# Patient Record
Sex: Female | Born: 1965 | Race: White | Hispanic: No | Marital: Married | Smoking: Former smoker
Health system: Southern US, Community
[De-identification: ages and names within clinical notes are randomized; demographics above are authoritative.]

## PROBLEM LIST (undated history)

## (undated) DIAGNOSIS — R809 Proteinuria, unspecified: Secondary | ICD-10-CM

## (undated) DIAGNOSIS — K219 Gastro-esophageal reflux disease without esophagitis: Secondary | ICD-10-CM

## (undated) DIAGNOSIS — E785 Hyperlipidemia, unspecified: Secondary | ICD-10-CM

## (undated) DIAGNOSIS — E119 Type 2 diabetes mellitus without complications: Secondary | ICD-10-CM

## (undated) DIAGNOSIS — I10 Essential (primary) hypertension: Secondary | ICD-10-CM

## (undated) DIAGNOSIS — J45909 Unspecified asthma, uncomplicated: Secondary | ICD-10-CM

## (undated) HISTORY — DX: Essential (primary) hypertension: I10

## (undated) HISTORY — DX: Hyperlipidemia, unspecified: E78.5

## (undated) HISTORY — DX: Unspecified asthma, uncomplicated: J45.909

## (undated) HISTORY — DX: Proteinuria, unspecified: R80.9

## (undated) HISTORY — DX: Type 2 diabetes mellitus without complications: E11.9

## (undated) HISTORY — PX: HERNIA REPAIR: SHX51

## (undated) HISTORY — DX: Gastro-esophageal reflux disease without esophagitis: K21.9

---

## 1999-01-27 ENCOUNTER — Other Ambulatory Visit: Admission: RE | Admit: 1999-01-27 | Discharge: 1999-01-27 | Payer: Self-pay | Admitting: Family Medicine

## 2000-05-05 ENCOUNTER — Other Ambulatory Visit: Admission: RE | Admit: 2000-05-05 | Discharge: 2000-05-05 | Payer: Self-pay | Admitting: Family Medicine

## 2001-06-22 ENCOUNTER — Other Ambulatory Visit: Admission: RE | Admit: 2001-06-22 | Discharge: 2001-06-22 | Payer: Self-pay | Admitting: Family Medicine

## 2002-06-30 ENCOUNTER — Other Ambulatory Visit: Admission: RE | Admit: 2002-06-30 | Discharge: 2002-06-30 | Payer: Self-pay | Admitting: Family Medicine

## 2002-07-04 ENCOUNTER — Ambulatory Visit (HOSPITAL_COMMUNITY): Admission: RE | Admit: 2002-07-04 | Discharge: 2002-07-04 | Payer: Self-pay | Admitting: Family Medicine

## 2002-07-04 ENCOUNTER — Encounter: Payer: Self-pay | Admitting: Family Medicine

## 2011-09-07 ENCOUNTER — Other Ambulatory Visit: Payer: Self-pay | Admitting: Family Medicine

## 2011-09-09 ENCOUNTER — Ambulatory Visit
Admission: RE | Admit: 2011-09-09 | Discharge: 2011-09-09 | Disposition: A | Discharge status: Home / Self Care | Payer: BC Managed Care – PPO | Source: Ambulatory Visit | Attending: Family Medicine | Admitting: Family Medicine

## 2012-11-03 ENCOUNTER — Ambulatory Visit: Payer: Self-pay | Admitting: Family Medicine

## 2013-04-24 ENCOUNTER — Other Ambulatory Visit: Payer: Self-pay | Admitting: Family Medicine

## 2013-04-26 NOTE — Telephone Encounter (Signed)
LAST SEEN 07/01/12

## 2013-04-28 NOTE — Telephone Encounter (Signed)
Patient needs to be seen. Has exceeded time since last visit. Limited quantity refilled. Needs to bring all medications to next appointment.   

## 2013-06-08 ENCOUNTER — Ambulatory Visit (INDEPENDENT_AMBULATORY_CARE_PROVIDER_SITE_OTHER): Payer: BC Managed Care – PPO | Admitting: Family Medicine

## 2013-06-08 ENCOUNTER — Telehealth: Payer: Self-pay | Admitting: Family Medicine

## 2013-06-08 ENCOUNTER — Encounter: Payer: Self-pay | Admitting: Family Medicine

## 2013-06-08 VITALS — BP 135/79 | HR 90 | Temp 97.0°F | Ht 67.0 in | Wt 171.6 lb

## 2013-06-08 DIAGNOSIS — E1169 Type 2 diabetes mellitus with other specified complication: Secondary | ICD-10-CM | POA: Insufficient documentation

## 2013-06-08 DIAGNOSIS — I10 Essential (primary) hypertension: Secondary | ICD-10-CM

## 2013-06-08 DIAGNOSIS — E785 Hyperlipidemia, unspecified: Secondary | ICD-10-CM

## 2013-06-08 DIAGNOSIS — E1159 Type 2 diabetes mellitus with other circulatory complications: Secondary | ICD-10-CM | POA: Insufficient documentation

## 2013-06-08 DIAGNOSIS — J45909 Unspecified asthma, uncomplicated: Secondary | ICD-10-CM | POA: Insufficient documentation

## 2013-06-08 DIAGNOSIS — K219 Gastro-esophageal reflux disease without esophagitis: Secondary | ICD-10-CM | POA: Insufficient documentation

## 2013-06-08 DIAGNOSIS — E119 Type 2 diabetes mellitus without complications: Secondary | ICD-10-CM | POA: Insufficient documentation

## 2013-06-08 DIAGNOSIS — R809 Proteinuria, unspecified: Secondary | ICD-10-CM | POA: Insufficient documentation

## 2013-06-08 DIAGNOSIS — I152 Hypertension secondary to endocrine disorders: Secondary | ICD-10-CM | POA: Insufficient documentation

## 2013-06-08 LAB — POCT UA - MICROALBUMIN: Microalbumin Ur, POC: NEGATIVE mg/L

## 2013-06-08 LAB — POCT GLYCOSYLATED HEMOGLOBIN (HGB A1C): Hemoglobin A1C: 8.3

## 2013-06-08 MED ORDER — LISINOPRIL-HYDROCHLOROTHIAZIDE 10-12.5 MG PO TABS: 1.0000 | ORAL_TABLET | Freq: Every day | ORAL | Status: DC

## 2013-06-08 MED ORDER — ALBUTEROL SULFATE HFA 108 (90 BASE) MCG/ACT IN AERS: INHALATION_SPRAY | RESPIRATORY_TRACT | Status: DC

## 2013-06-08 MED ORDER — SITAGLIPTIN PHOSPHATE 100 MG PO TABS: 100.0000 mg | ORAL_TABLET | Freq: Every day | ORAL | Status: DC

## 2013-06-08 MED ORDER — FLUTICASONE-SALMETEROL 250-50 MCG/DOSE IN AEPB: 1.0000 | INHALATION_SPRAY | Freq: Two times a day (BID) | RESPIRATORY_TRACT | Status: DC

## 2013-06-08 MED ORDER — GLIPIZIDE 5 MG PO TABS: 5.0000 mg | ORAL_TABLET | Freq: Two times a day (BID) | ORAL | Status: DC

## 2013-06-08 NOTE — Patient Instructions (Signed)
    Dr Bonnie Roig's Recommendations  For nutrition information, I recommend books:  1).Eat to Live by Dr Joel Fuhrman. 2).Prevent and Reverse Heart Disease by Dr Caldwell Esselstyn. 3) Dr Neal Barnard's Book:  Program to Reverse Diabetes  Exercise recommendations are:  If unable to walk, then the patient can exercise in a chair 3 times a day. By flapping arms like a bird gently and raising legs outwards to the front.  If ambulatory, the patient can go for walks for 30 minutes 3 times a week. Then increase the intensity and duration as tolerated.  Goal is to try to attain exercise frequency to 5 times a week.  If applicable: Best to perform resistance exercises (machines or weights) 2 days a week and cardio type exercises 3 days per week.   Diabetes and Foot Care Diabetes may cause you to have problems because of poor blood supply (circulation) to your feet and legs. This may cause the skin on your feet to become thinner, break easier, and heal more slowly. Your skin may become dry, and the skin may peel and crack. You may also have nerve damage in your legs and feet causing decreased feeling in them. You may not notice minor injuries to your feet that could lead to infections or more serious problems. Taking care of your feet is one of the most important things you can do for yourself.  HOME CARE INSTRUCTIONS  Wear shoes at all times, even in the house. Do not go barefoot. Bare feet are easily injured.  Check your feet daily for blisters, cuts, and redness. If you cannot see the bottom of your feet, use a mirror or ask someone for help.  Wash your feet with warm water (do not use hot water) and mild soap. Then pat your feet and the areas between your toes until they are completely dry. Do not soak your feet as this can dry your skin.  Apply a moisturizing lotion or petroleum jelly (that does not contain alcohol and is unscented) to the skin on your feet and to dry, brittle toenails.  Do not apply lotion between your toes.  Trim your toenails straight across. Do not dig under them or around the cuticle. File the edges of your nails with an emery board or nail file.  Do not cut corns or calluses or try to remove them with medicine.  Wear clean socks or stockings every day. Make sure they are not too tight. Do not wear knee-high stockings since they may decrease blood flow to your legs.  Wear shoes that fit properly and have enough cushioning. To break in new shoes, wear them for just a few hours a day. This prevents you from injuring your feet. Always look in your shoes before you put them on to be sure there are no objects inside.  Do not cross your legs. This may decrease the blood flow to your feet.  If you find a minor scrape, cut, or break in the skin on your feet, keep it and the skin around it clean and dry. These areas may be cleansed with mild soap and water. Do not cleanse the area with peroxide, alcohol, or iodine.  When you remove an adhesive bandage, be sure not to damage the skin around it.  If you have a wound, look at it several times a day to make sure it is healing.  Do not use heating pads or hot water bottles. They may burn your skin. If   you have lost feeling in your feet or legs, you may not know it is happening until it is too late.  Make sure your health care provider performs a complete foot exam at least annually or more often if you have foot problems. Report any cuts, sores, or bruises to your health care provider immediately. SEEK MEDICAL CARE IF:   You have an injury that is not healing.  You have cuts or breaks in the skin.  You have an ingrown nail.  You notice redness on your legs or feet.  You feel burning or tingling in your legs or feet.  You have pain or cramps in your legs and feet.  Your legs or feet are numb.  Your feet always feel cold. SEEK IMMEDIATE MEDICAL CARE IF:   There is increasing redness, swelling, or pain in  or around a wound.  There is a red line that goes up your leg.  Pus is coming from a wound.  You develop a fever or as directed by your health care provider.  You notice a bad smell coming from an ulcer or wound. Document Released: 05/01/2000 Document Revised: 01/04/2013 Document Reviewed: 10/11/2012 [employer] Patient Information 2014 [employer].  

## 2013-06-08 NOTE — Telephone Encounter (Signed)
PT AWARE THERE ARE NO SAMPLES LEFT IN ANY BATHROOM

## 2013-06-08 NOTE — Progress Notes (Signed)
Patient ID: Brittany Jenkins, female   DOB: [birth year], 48 y.o.   MRN: [id number] SUBJECTIVE: CC: Chief Complaint  Patient presents with  . Follow-up    ck up diabetes     HPI: Patient is here for follow up of Diabetes Mellitus: Symptoms evaluated: Denies Nocturia ,Denies Urinary Frequency , denies Blurred vision ,deniesDizziness,denies.Dysuria,denies paresthesias, denies extremity pain or ulcers.Marland Kitchendenies chest pain. has had an annual eye exam. do check the feet. Does check CBGs. Average CBG: 122.  Denies episodes of hypoglycemia. Does have an emergency hypoglycemic plan. admits toCompliance with medications. Denies Problems with medications.   Has a flare up of asthma. Dad had a respiratory infection she caught it and still sick with cough. No fever. Had a lot of congestion. And phlegm. Her sugars were good until she got sick.  Past Medical History  Diagnosis Date  . Diabetes mellitus without complication   . Asthma   . GERD (gastroesophageal reflux disease)   . Hyperlipidemia   . Hypertension   . Microalbuminuria    No past surgical history on file. History   Social History  . Marital Status: Married    Spouse Name: N/A    Number of Children: N/A  . Years of Education: N/A   Occupational History  . Not on file.   Social History Main Topics  . Smoking status: Former Smoker    Types: Cigarettes    Quit date: 05/18/1998  . Smokeless tobacco: Not on file  . Alcohol Use: Not on file  . Drug Use: Not on file  . Sexual Activity: Not on file   Other Topics Concern  . Not on file   Social History Narrative  . No narrative on file   No family history on file. No current outpatient prescriptions on file prior to visit.   No current facility-administered medications on file prior to visit.   Allergies  Allergen Reactions  . Erythromycin    Immunization History  Administered Date(s) Administered  . Tdap 04/08/2008   Prior to Admission medications    Medication Sig Start Date End Date Taking? Authorizing Provider  VENTOLIN HFA 108 (90 BASE) MCG/ACT inhaler INHALE 2 PUFFS BY MOUTH EVERY 4 TO 6 HOURS AS A RESCUE FOR ASTHMA 04/24/13   Vernie Shanks, MD     ROS: As above in the HPI. All other systems are stable or negative.  OBJECTIVE: APPEARANCE:  Patient in no acute distress.The patient appeared well nourished and normally developed. Acyanotic. Waist: VITAL SIGNS:BP 135/79  Pulse 90  Temp(Src) 97 F (36.1 C) (Oral)  Ht $R'5\' 7"'jJ$  (9.628 m)  Wt 171 lb 9.6 oz (77.837 kg)  BMI 26.87 kg/m2  LMP 05/08/2013 WF   SKIN: warm and  Dry without overt rashes, tattoos and scars  HEAD and Neck: without JVD, Head and scalp: normal Eyes:No scleral icterus. Fundi normal, eye movements normal. Ears: Auricle normal, canal normal, Tympanic membranes normal, insufflation normal. Nose: normal Throat: normal Neck & thyroid: normal  CHEST & LUNGS: Chest wall: normal Lungs: Clear  CVS: Reveals the PMI to be normally located. Regular rhythm, First and Second Heart sounds are normal,  absence of murmurs, rubs or gallops. Peripheral vasculature: Radial pulses: normal Dorsal pedis pulses: normal Posterior pulses: normal  ABDOMEN:  Appearance: normal Benign, no organomegaly, no masses, no Abdominal Aortic enlargement. No Guarding , no rebound. No Bruits. Bowel sounds: normal  RECTAL: N/A GU: N/A  EXTREMETIES: nonedematous.  MUSCULOSKELETAL:  Spine: normal Joints: intact  NEUROLOGIC: oriented to time,place  and person; nonfocal. Strength is normal Sensory is normal Reflexes are normal Cranial Nerves are normal.  ASSESSMENT:  Microalbuminuria - Plan: POCT UA - Microalbumin  Hypertension - Plan: lisinopril-hydrochlorothiazide (PRINZIDE,ZESTORETIC) 10-12.5 MG per tablet, CMP14+EGFR  Hyperlipidemia - Plan: CMP14+EGFR, NMR, lipoprofile  Diabetes mellitus without complication - Plan: glipiZIDE (GLUCOTROL) 5 MG tablet, sitaGLIPtin  (JANUVIA) 100 MG tablet, POCT glycosylated hemoglobin (Hb A1C), POCT UA - Microalbumin  GERD (gastroesophageal reflux disease)  Asthma - Plan: Fluticasone-Salmeterol (ADVAIR) 250-50 MCG/DOSE AEPB, albuterol (VENTOLIN HFA) 108 (90 BASE) MCG/ACT inhaler  PLAN:      Dr Paula Libra Recommendations  For nutrition information, I recommend books:  1).Eat to Live by Dr Excell Seltzer. 2).Prevent and Reverse Heart Disease by Dr Karl Luke. 3) Dr Janene Harvey Book:  Program to Reverse Diabetes  Exercise recommendations are:  If unable to walk, then the patient can exercise in a chair 3 times a day. By flapping arms like a bird gently and raising legs outwards to the front.  If ambulatory, the patient can go for walks for 30 minutes 3 times a week. Then increase the intensity and duration as tolerated.  Goal is to try to attain exercise frequency to 5 times a week.  If applicable: Best to perform resistance exercises (machines or weights) 2 days a week and cardio type exercises 3 days per week.   DM foot Care discussed and handout in AVS.  Orders Placed This Encounter  Procedures  . CMP14+EGFR  . NMR, lipoprofile  . POCT glycosylated hemoglobin (Hb A1C)  . POCT UA - Microalbumin   Meds ordered this encounter  Medications  . DISCONTD: ADVAIR DISKUS 100-50 MCG/DOSE AEPB    Sig: Inhale 1 puff into the lungs at bedtime.   Marland Kitchen DISCONTD: glipiZIDE (GLUCOTROL) 5 MG tablet    Sig: Take 5 mg by mouth. Take total of $Remove'10mg'JlxVSIl$  nightly  . DISCONTD: lisinopril-hydrochlorothiazide (PRINZIDE,ZESTORETIC) 10-12.5 MG per tablet    Sig: Take 1 tablet by mouth daily.   Marland Kitchen DISCONTD: JANUVIA 100 MG tablet    Sig: Take 100 mg by mouth daily.   . traMADol (ULTRAM) 50 MG tablet    Sig: Take by mouth every 6 (six) hours as needed.  Marland Kitchen omeprazole (PRILOSEC OTC) 20 MG tablet    Sig: Take 20 mg by mouth daily.  . vitamin E 400 UNIT capsule    Sig: Take 400 Units by mouth daily.  . Omega-3 Fatty Acids  (FISH OIL) 1000 MG CAPS    Sig: Take 2 capsules by mouth daily.  . Fluticasone-Salmeterol (ADVAIR) 250-50 MCG/DOSE AEPB    Sig: Inhale 1 puff into the lungs 2 (two) times daily.    Dispense:  60 each    Refill:  3  . albuterol (VENTOLIN HFA) 108 (90 BASE) MCG/ACT inhaler    Sig: INHALE 2 PUFFS BY MOUTH EVERY 4 TO 6 HOURS AS A RESCUE FOR ASTHMA    Dispense:  1 Inhaler    Refill:  2    Last refill. Needs to be seen.  Marland Kitchen glipiZIDE (GLUCOTROL) 5 MG tablet    Sig: Take 1 tablet (5 mg total) by mouth 2 (two) times daily before a meal. Take total of $Remove'10mg'CjeZoBZ$  nightly    Dispense:  60 tablet    Refill:  5  . sitaGLIPtin (JANUVIA) 100 MG tablet    Sig: Take 1 tablet (100 mg total) by mouth daily.    Dispense:  30 tablet    Refill:  5  .  lisinopril-hydrochlorothiazide (PRINZIDE,ZESTORETIC) 10-12.5 MG per tablet    Sig: Take 1 tablet by mouth daily.    Dispense:  30 tablet    Refill:  5   Medications Discontinued During This Encounter  Medication Reason  . ADVAIR DISKUS 100-50 MCG/DOSE AEPB Dose change  . VENTOLIN HFA 108 (90 BASE) MCG/ACT inhaler Reorder  . glipiZIDE (GLUCOTROL) 5 MG tablet Reorder  . JANUVIA 100 MG tablet Reorder  . lisinopril-hydrochlorothiazide (PRINZIDE,ZESTORETIC) 10-12.5 MG per tablet Reorder   Return in about 3 months (around 09/06/2013) for Recheck medical problems.  Staley Budzinski P. Jacelyn Grip, M.D.

## 2013-06-09 ENCOUNTER — Telehealth: Payer: Self-pay | Admitting: Family Medicine

## 2013-06-09 NOTE — Telephone Encounter (Signed)
Samples of januvia up front. Advair not available. Patient notified

## 2013-06-10 LAB — CMP14+EGFR
ALT: 16 IU/L (ref 0–32)
AST: 13 IU/L (ref 0–40)
Albumin/Globulin Ratio: 1.5 (ref 1.1–2.5)
Albumin: 4.1 g/dL (ref 3.5–5.5)
Alkaline Phosphatase: 64 IU/L (ref 39–117)
BUN/Creatinine Ratio: 14 (ref 9–23)
BUN: 10 mg/dL (ref 6–24)
CO2: 22 mmol/L (ref 18–29)
Calcium: 9.3 mg/dL (ref 8.7–10.2)
Chloride: 97 mmol/L (ref 97–108)
Creatinine, Ser: 0.7 mg/dL (ref 0.57–1.00)
GFR calc Af Amer: 118 mL/min/{1.73_m2} (ref >59)
GFR calc non Af Amer: 102 mL/min/{1.73_m2} (ref >59)
Globulin, Total: 2.7 g/dL (ref 1.5–4.5)
Glucose: 166 mg/dL — ABNORMAL HIGH (ref 65–99)
Potassium: 4.1 mmol/L (ref 3.5–5.2)
Sodium: 137 mmol/L (ref 134–144)
Total Bilirubin: 0.2 mg/dL (ref 0.0–1.2)
Total Protein: 6.8 g/dL (ref 6.0–8.5)

## 2013-06-10 LAB — NMR, LIPOPROFILE
Cholesterol: 222 mg/dL — ABNORMAL HIGH (ref <200)
HDL Cholesterol by NMR: 54 mg/dL (ref >=40)
HDL Particle Number: 33.3 umol/L (ref >=30.5)
LDL Particle Number: 2208 nmol/L — ABNORMAL HIGH (ref <1000)
LDL Size: 20.5 nm — ABNORMAL LOW (ref >20.5)
LDLC SERPL CALC-MCNC: 141 mg/dL — ABNORMAL HIGH (ref <100)
LP-IR Score: 83 — ABNORMAL HIGH (ref <=45)
Small LDL Particle Number: 1232 nmol/L — ABNORMAL HIGH (ref <=527)
Triglycerides by NMR: 136 mg/dL (ref <150)

## 2013-06-14 ENCOUNTER — Other Ambulatory Visit: Payer: Self-pay | Admitting: *Deleted

## 2013-06-14 NOTE — Telephone Encounter (Signed)
Last seen 06/08/13, last filled 10/12/12. Rx will print have nurse notify pt

## 2013-06-15 ENCOUNTER — Other Ambulatory Visit: Payer: Self-pay | Admitting: *Deleted

## 2013-06-15 NOTE — Telephone Encounter (Signed)
Patient last seen in office on 06-08-13. Please advise. If approved please print and route to Pool A so nurse can phone in to pharmacy

## 2013-06-16 MED ORDER — TRAMADOL HCL 50 MG PO TABS: 50.0000 mg | ORAL_TABLET | Freq: Four times a day (QID) | ORAL | Status: DC | PRN

## 2013-06-16 NOTE — Telephone Encounter (Signed)
Ready to be picked up by patient left message

## 2013-06-16 NOTE — Telephone Encounter (Signed)
Rx ready for pick up. 

## 2013-09-08 ENCOUNTER — Ambulatory Visit: Payer: BC Managed Care – PPO | Admitting: Family Medicine

## 2013-12-21 ENCOUNTER — Telehealth: Payer: Self-pay | Admitting: Family Medicine

## 2013-12-21 DIAGNOSIS — E119 Type 2 diabetes mellitus without complications: Secondary | ICD-10-CM

## 2013-12-22 MED ORDER — GLIPIZIDE 5 MG PO TABS: 5.0000 mg | ORAL_TABLET | Freq: Two times a day (BID) | ORAL | Status: DC

## 2013-12-22 NOTE — Telephone Encounter (Signed)
done

## 2014-01-18 ENCOUNTER — Other Ambulatory Visit: Payer: Self-pay | Admitting: Family Medicine

## 2014-01-23 ENCOUNTER — Other Ambulatory Visit: Payer: Self-pay | Admitting: Family Medicine

## 2014-01-23 DIAGNOSIS — E119 Type 2 diabetes mellitus without complications: Secondary | ICD-10-CM

## 2014-01-24 NOTE — Telephone Encounter (Signed)
Has appt with you on 03/26/14

## 2014-01-25 MED ORDER — SITAGLIPTIN PHOSPHATE 100 MG PO TABS: 100.0000 mg | ORAL_TABLET | Freq: Every day | ORAL | Status: DC

## 2014-01-25 MED ORDER — ALBUTEROL SULFATE HFA 108 (90 BASE) MCG/ACT IN AERS: INHALATION_SPRAY | RESPIRATORY_TRACT | Status: DC

## 2014-01-26 ENCOUNTER — Encounter: Payer: Self-pay | Admitting: *Deleted

## 2014-02-19 ENCOUNTER — Other Ambulatory Visit: Payer: Self-pay | Admitting: *Deleted

## 2014-02-19 MED ORDER — LISINOPRIL-HYDROCHLOROTHIAZIDE 10-12.5 MG PO TABS: 1.0000 | ORAL_TABLET | Freq: Every day | ORAL | Status: DC

## 2014-03-15 ENCOUNTER — Other Ambulatory Visit: Payer: Self-pay | Admitting: Family Medicine

## 2014-03-20 ENCOUNTER — Ambulatory Visit: Payer: BC Managed Care – PPO | Admitting: Family Medicine

## 2014-03-21 ENCOUNTER — Encounter: Payer: Self-pay | Admitting: Family Medicine

## 2014-03-21 ENCOUNTER — Ambulatory Visit (INDEPENDENT_AMBULATORY_CARE_PROVIDER_SITE_OTHER): Payer: BC Managed Care – PPO | Admitting: Family Medicine

## 2014-03-21 VITALS — BP 129/67 | HR 93 | Temp 97.0°F | Ht 67.0 in | Wt 168.2 lb

## 2014-03-21 DIAGNOSIS — E785 Hyperlipidemia, unspecified: Secondary | ICD-10-CM

## 2014-03-21 DIAGNOSIS — I10 Essential (primary) hypertension: Secondary | ICD-10-CM

## 2014-03-21 DIAGNOSIS — E119 Type 2 diabetes mellitus without complications: Secondary | ICD-10-CM

## 2014-03-21 DIAGNOSIS — J4530 Mild persistent asthma, uncomplicated: Secondary | ICD-10-CM

## 2014-03-21 LAB — POCT GLYCOSYLATED HEMOGLOBIN (HGB A1C): Hemoglobin A1C: 9

## 2014-03-21 MED ORDER — TRAMADOL HCL 50 MG PO TABS: 50.0000 mg | ORAL_TABLET | Freq: Four times a day (QID) | ORAL | Status: DC | PRN

## 2014-03-21 MED ORDER — SITAGLIPTIN PHOSPHATE 100 MG PO TABS: 100.0000 mg | ORAL_TABLET | Freq: Every day | ORAL | Status: DC

## 2014-03-21 MED ORDER — EZETIMIBE 10 MG PO TABS: 10.0000 mg | ORAL_TABLET | Freq: Every day | ORAL | Status: DC

## 2014-03-21 MED ORDER — IPRATROPIUM-ALBUTEROL 0.5-2.5 (3) MG/3ML IN SOLN
3.0000 mL | Freq: Four times a day (QID) | RESPIRATORY_TRACT | Status: DC | PRN
Start: 2014-03-21 — End: 2015-04-24

## 2014-03-21 MED ORDER — LISINOPRIL-HYDROCHLOROTHIAZIDE 10-12.5 MG PO TABS: 1.0000 | ORAL_TABLET | Freq: Every day | ORAL | Status: DC

## 2014-03-21 MED ORDER — FLUTICASONE-SALMETEROL 250-50 MCG/DOSE IN AEPB: 1.0000 | INHALATION_SPRAY | Freq: Two times a day (BID) | RESPIRATORY_TRACT | Status: DC

## 2014-03-21 NOTE — Progress Notes (Signed)
   Subjective:    Patient ID: Brittany Jenkins, female    DOB: May 05, 1966, 48 y.o.   MRN: [id number]  HPI 48 year old female here to follow-up with her diabetes, hypertension, and hyperlipidemia,. Blood pressure is well controlled with lisinopril hydrochlorothiazide. Even though she has lost 4 pounds since her last visit she states that her A1c will not be at goal because she does not follow her diet very well. She did not follow-up with the clinical pharmacist has requested. Her asthma does pretty good with Advair and when necessary albuterol. She does like to have you and have for her nebulizer when needed.    Review of Systems  Constitutional: Negative.   HENT: Negative.   Eyes: Negative.   Respiratory: Positive for wheezing.   Cardiovascular: Negative.   Gastrointestinal: Negative.   Endocrine: Negative.   Genitourinary: Negative.   Musculoskeletal: Positive for myalgias.       Bilateral thumb pain  Skin: Rash: not true rash but healing intertrigo.  Hematological: Negative.   Psychiatric/Behavioral: Negative.        Objective:   Physical Exam  Constitutional: She is oriented to person, place, and time. She appears well-developed and well-nourished.  Eyes: Conjunctivae and EOM are normal.  Neck: Normal range of motion. Neck supple.  Cardiovascular: Normal rate, regular rhythm and normal heart sounds.   Pulmonary/Chest: Effort normal and breath sounds normal.  Abdominal: Soft. Bowel sounds are normal.  Musculoskeletal: Normal range of motion.  Neurological: She is alert and oriented to person, place, and time. She has normal reflexes.  Skin: Skin is warm and dry.  Psychiatric: She has a normal mood and affect. Her behavior is normal. Thought content normal.    BP 129/67 mmHg  Pulse 93  Temp(Src) 97 F (36.1 C) (Oral)  Ht 5\' 7"  (1.702 m)  Wt 168 lb 3.2 oz (76.295 kg)  BMI 26.34 kg/m2      Assessment & Plan:  1. Essential hypertension Blood pressure well controlled  on current medical regimen. There are no side effects.  2. Diabetes mellitus without complication Suspect A1c will not be at goal may need to adjust current medicines or consider adding endochondroma or similar medicine  3. Hyperlipidemia We'll add Zetia and recheck lipids in 3 months  4. Asthma, mild persistent, uncomplicated  - ipratropium-albuterol (DUONEB) 0.5-2.5 (3) MG/3ML SOLN; Take 3 mLs by nebulization every 6 (six) hours as needed.  Dispense: 360 mL; Refill: 1

## 2014-03-21 NOTE — Patient Instructions (Signed)

## 2014-04-02 ENCOUNTER — Other Ambulatory Visit: Payer: Self-pay | Admitting: Family Medicine

## 2014-04-03 ENCOUNTER — Telehealth: Payer: Self-pay | Admitting: Family Medicine

## 2014-04-04 ENCOUNTER — Other Ambulatory Visit: Payer: Self-pay

## 2014-04-04 MED ORDER — GLIPIZIDE 5 MG PO TABS: 5.0000 mg | ORAL_TABLET | Freq: Four times a day (QID) | ORAL | Status: DC

## 2014-04-04 NOTE — Telephone Encounter (Signed)
Okay to refill meds per patient request

## 2014-04-09 ENCOUNTER — Other Ambulatory Visit: Payer: Self-pay | Admitting: Family Medicine

## 2014-04-10 NOTE — Telephone Encounter (Signed)
Last refill 03/15/14.

## 2014-06-21 ENCOUNTER — Other Ambulatory Visit: Payer: Self-pay | Admitting: Family Medicine

## 2014-07-05 ENCOUNTER — Other Ambulatory Visit: Payer: Self-pay | Admitting: Family Medicine

## 2014-07-09 ENCOUNTER — Telehealth: Payer: Self-pay | Admitting: *Deleted

## 2014-07-09 NOTE — Telephone Encounter (Signed)
BS yesterday were 300-350. This am it was 263 at 8:30. She is on glipizide 5mg  QID and [place] and she wants to know what she can do. Patient see's Hyacinth MeekerMiller.

## 2014-07-09 NOTE — Telephone Encounter (Signed)
This patient really needs to come in and see a provider. She needs a BMP and a hemoglobin A1c and she needs to check her blood sugars more regularly before meals and at bedtime. Please schedule a visit with the clinical pharmacists and bring blood sugars in for review at that time. She should get her lab work as soon as possible as there are other medications that could be added to her current regimen. Please bring blood sugars in and check them regularly so that the provider that sees her can adjust her medicines or add additional medications to what she is taking

## 2014-07-09 NOTE — Telephone Encounter (Signed)
Appointment given for weds at 1:55

## 2014-07-11 ENCOUNTER — Encounter (INDEPENDENT_AMBULATORY_CARE_PROVIDER_SITE_OTHER): Payer: Self-pay

## 2014-07-11 ENCOUNTER — Ambulatory Visit (INDEPENDENT_AMBULATORY_CARE_PROVIDER_SITE_OTHER): Payer: BLUE CROSS/BLUE SHIELD | Admitting: Family Medicine

## 2014-07-11 ENCOUNTER — Encounter: Payer: Self-pay | Admitting: Family Medicine

## 2014-07-11 VITALS — BP 156/74 | HR 95 | Temp 99.2°F | Ht 67.0 in | Wt 168.4 lb

## 2014-07-11 DIAGNOSIS — R5383 Other fatigue: Secondary | ICD-10-CM | POA: Diagnosis not present

## 2014-07-11 DIAGNOSIS — E1165 Type 2 diabetes mellitus with hyperglycemia: Secondary | ICD-10-CM

## 2014-07-11 DIAGNOSIS — E785 Hyperlipidemia, unspecified: Secondary | ICD-10-CM | POA: Diagnosis not present

## 2014-07-11 DIAGNOSIS — IMO0002 Reserved for concepts with insufficient information to code with codable children: Secondary | ICD-10-CM

## 2014-07-11 LAB — POCT GLYCOSYLATED HEMOGLOBIN (HGB A1C): HEMOGLOBIN A1C: 9.5

## 2014-07-11 MED ORDER — LIRAGLUTIDE 18 MG/3ML ~~LOC~~ SOPN: 0.6000 mg | PEN_INJECTOR | Freq: Every day | SUBCUTANEOUS | Status: DC

## 2014-07-11 NOTE — Progress Notes (Signed)
Subjective:  Patient ID: Brittany Jenkins, female    DOB: 07/04/1965  Age: 49 y.o. MRN: [id number]  CC: Diabetes; Hyperlipidemia; and Hypertension   HPI Brittany Jenkins presents for Patient does not check blood sugar at home Patient denies symptoms such as polyuria, polydipsia, excessive hunger, nausea No significant hypoglycemic spells noted. Medications as noted below. Taking them regularly without complication/adverse reaction being reported today.   Patient in for follow-up of elevated cholesterol. Doing well without complaints on current medication. Denies side effects of statin including myalgia and arthralgia and nausea. Also in today for liver function testing. Currently no chest pain, shortness of breath or other cardiovascular related symptoms noted.  Patient in for follow-up of hypertension. Patient has no history of headache chest pain or shortness of breath or recent cough. Patient also denies symptoms of TIA such as numbness weakness lateralizing. Patient checks  blood pressure at home and has not had any elevated readings recently. Patient denies side effects from his medication. States taking it regularly.   story [name] has a past medical history of Diabetes mellitus without complication; Asthma; GERD (gastroesophageal reflux disease); Hyperlipidemia; Hypertension; and Microalbuminuria.   She has past surgical history that includes Hernia repair.   Her family history includes Arthritis in her father and mother; Asthma in her mother; Diabetes in her father and mother; GER disease in her father; Hyperlipidemia in her father and mother; Hypertension in her father and mother.She reports that she quit smoking about 16 years ago. Her smoking use included Cigarettes. She does not have any smokeless tobacco history on file. Her alcohol and drug histories are not on file.  Current Outpatient Prescriptions on File Prior to Visit  Medication Sig Dispense Refill  . aspirin EC 81 MG  tablet Take 81 mg by mouth daily.    . cholecalciferol (VITAMIN D) 1000 UNITS tablet Take 1,000 Units by mouth daily.    . Fluticasone-Salmeterol (ADVAIR) 250-50 MCG/DOSE AEPB Inhale 1 puff into the lungs 2 (two) times daily. 60 each 3  . glipiZIDE (GLUCOTROL) 5 MG tablet TAKE ONE TABLET BY MOUTH FOUR TIMES A DAY AS DIRECTED 120 tablet 0  . ipratropium-albuterol (DUONEB) 0.5-2.5 (3) MG/3ML SOLN Take 3 mLs by nebulization every 6 (six) hours as needed. 360 mL 1  . JANUVIA 100 MG tablet TAKE ONE TABLET BY MOUTH ONE TIME DAILY 30 tablet 0  . lisinopril-hydrochlorothiazide (PRINZIDE,ZESTORETIC) 10-12.5 MG per tablet Take 1 tablet by mouth daily. 90 tablet 2  . Omega-3 Fatty Acids (FISH OIL) 1000 MG CAPS Take 2 capsules by mouth daily.    Marland Kitchen omeprazole (PRILOSEC OTC) 20 MG tablet Take 20 mg by mouth daily.    . traMADol (ULTRAM) 50 MG tablet Take 1 tablet (50 mg total) by mouth every 6 (six) hours as needed. 30 tablet 0  . VENTOLIN HFA 108 (90 BASE) MCG/ACT inhaler INHALE 2 PUFFS BY MOUTH EVERY 4 TO 6 HOURS AS A RESCUE FOR ASTHMA 18 g 2  . ezetimibe (ZETIA) 10 MG tablet Take 1 tablet (10 mg total) by mouth daily. (Patient not taking: Reported on 07/11/2014) 90 tablet 3   No current facility-administered medications on file prior to visit.    ROS Review of Systems  Constitutional: Negative for fever, chills, diaphoresis, appetite change, fatigue and unexpected weight change.  HENT: Negative for congestion, ear pain, hearing loss, postnasal drip, rhinorrhea, sneezing, sore throat and trouble swallowing.   Eyes: Negative for pain.  Respiratory: Negative for cough, chest tightness and shortness of breath.  Cardiovascular: Negative for chest pain and palpitations.  Gastrointestinal: Negative for nausea, vomiting, abdominal pain, diarrhea and constipation.  Genitourinary: Negative for dysuria, frequency and menstrual problem.  Musculoskeletal: Negative for joint swelling and arthralgias.  Skin:  Negative for rash.  Neurological: Negative for dizziness, weakness, numbness and headaches.  Psychiatric/Behavioral: Negative for dysphoric mood and agitation.    Objective:  BP 156/74 mmHg  Pulse 95  Temp(Src) 99.2 F (37.3 C) (Oral)  Ht 5' 7"  (1.702 m)  Wt 168 lb 6.4 oz (76.386 kg)  BMI 26.37 kg/m2  LMP 05/23/2014 (Approximate)  BP Readings from Last 3 Encounters:  07/11/14 156/74  03/21/14 129/67  06/08/13 135/79    Wt Readings from Last 3 Encounters:  07/11/14 168 lb 6.4 oz (76.386 kg)  03/21/14 168 lb 3.2 oz (76.295 kg)  06/08/13 171 lb 9.6 oz (77.837 kg)     Physical Exam  Constitutional: She is oriented to person, place, and time. She appears well-developed and well-nourished. No distress.  HENT:  Head: Normocephalic and atraumatic.  Right Ear: External ear normal.  Left Ear: External ear normal.  Nose: Nose normal.  Mouth/Throat: Oropharynx is clear and moist.  Eyes: Conjunctivae and EOM are normal. Pupils are equal, round, and reactive to light.  Neck: Normal range of motion. Neck supple. No thyromegaly present.  Cardiovascular: Normal rate, regular rhythm and normal heart sounds.   No murmur heard. Pulmonary/Chest: Effort normal and breath sounds normal. No respiratory distress. She has no wheezes. She has no rales.  Abdominal: Soft. Bowel sounds are normal. She exhibits no distension. There is no tenderness.  Lymphadenopathy:    She has no cervical adenopathy.  Neurological: She is alert and oriented to person, place, and time. She has normal reflexes.  Skin: Skin is warm and dry.  Psychiatric: She has a normal mood and affect. Her behavior is normal. Judgment and thought content normal.    Lab Results  Component Value Date   HGBA1C 9.5 07/11/2014   HGBA1C 9.0 03/21/2014   HGBA1C 8.3 06/08/2013    Lab Results  Component Value Date   GLUCOSE 197* 07/11/2014   CHOL 222* 06/08/2013   TRIG 136 06/08/2013   HDL 54 06/08/2013   LDLCALC 141*  06/08/2013   ALT 17 07/11/2014   AST 14 07/11/2014   NA 138 07/11/2014   K 3.9 07/11/2014   CL 96* 07/11/2014   CREATININE 0.71 07/11/2014   BUN 10 07/11/2014   CO2 24 07/11/2014   TSH 1.160 07/11/2014   HGBA1C 9.5 07/11/2014    US Abdomen Complete  09/09/2011   *RADIOLOGY REPORT*  Clinical Data:  Left lower abdominal pain with palpable mass in the subcutaneous soft tissues in the left lower quadrant.  Previous bilateral inguinal hernia repair.  COMPLETE ABDOMINAL ULTRASOUND  Comparison:  None.  Findings: The patient has a 1.4 x 0.6 x 1.2 cm hypoechoic palpable fluid collection in the subcutaneous fat of the left lower quadrant.  This correlates with the patients reported abnormality. The patient reports that she had trauma to that area several months ago and had significant bruising in the area.  The size and configuration and echogenicity of this lesion is consistent with residual seroma from the subcutaneous hematoma.  The underlying muscle structures are intact.  This does not represent a hernia.  Gallbladder:  No gallstones, gallbladder wall thickening, or pericholecystic fluid. Negative sonographic Murphy's sign.  Common bile duct:  Normal.  4.8 cm in diameter.  Liver:  No focal lesion identified.  Within normal limits in parenchymal echogenicity.  IVC:  Appears normal.  Pancreas:  Normal.  Spleen:  Normal.  4.9 cm in length.  Right Kidney:  Normal.  10.9 cm in length.  Left Kidney:  11.4 cm in length.  9 mm simple appearing cyst on the lower pole.  Abdominal aorta:  Normal.  2.4 cm maximal diameter.  IMPRESSION:  1.  Small residual seroma in the subcutaneous fat of the left lower quadrant from previous hematoma. 2.  Otherwise, normal exam.  Original Report Authenticated By: Larey Seat, M.D.   Assessment & Plan:   [place] was seen today for diabetes, hyperlipidemia and hypertension.  Diagnoses and all orders for this visit:  DM (diabetes mellitus), type 2, uncontrolled Orders: -      POCT glycosylated hemoglobin (Hb A1C) -     CMP14+EGFR  Hyperlipemia Orders: -     CMP14+EGFR -     Cancel: NMR, lipoprofile -     Lipid panel  Other fatigue Orders: -     Cancel: Thyroid Panel With TSH -     Vit D  25 hydroxy (rtn osteoporosis monitoring)  Other orders -     Liraglutide (VICTOZA) 18 MG/3ML SOPN; Inject 0.1 mLs (0.6 mg total) into the skin daily at 6 (six) AM. -     Thyroid Panel With TSH   I am having Ms. Derden start on Liraglutide. I am also having her maintain her omeprazole, Fish Oil, cholecalciferol, aspirin EC, ipratropium-albuterol, ezetimibe, Fluticasone-Salmeterol, lisinopril-hydrochlorothiazide, traMADol, VENTOLIN HFA, glipiZIDE, and JANUVIA.  Meds ordered this encounter  Medications  . Liraglutide (VICTOZA) 18 MG/3ML SOPN    Sig: Inject 0.1 mLs (0.6 mg total) into the skin daily at 6 (six) AM.    Dispense:  6 mL    Refill:  5    Comments: 45 minutes was spent with this patient of which over one half was spent in counseling regarding her diabetes and treatment for that.  Follow-up: Return in about 3 months (around 10/09/2014) for diabetes.  Claretta Fraise, M.D.

## 2014-07-11 NOTE — Patient Instructions (Signed)
Checked her blood sugar before breakfast and again 2 hours after lunch or 2 hours after supper daily and record the readings on your log sheet as given to today.  Checked her blood pressure daily.  Bring your log sheet for both to your follow-up visit in one week.  Inject Victoza 0.6 mg once a day at about the same time every day until your follow-up visit next week.  Diabetes and Exercise Exercising regularly is important. It is not just about losing weight. It has many health benefits, such as:  Improving your overall fitness, flexibility, and endurance.  Increasing your bone density.  Helping with weight control.  Decreasing your body fat.  Increasing your muscle strength.  Reducing stress and tension.  Improving your overall health. People with diabetes who exercise gain additional benefits because exercise:  Reduces appetite.  Improves the body's use of blood sugar (glucose).  Helps lower or control blood glucose.  Decreases blood pressure.  Helps control blood lipids (such as cholesterol and triglycerides).  Improves the body's use of the hormone insulin by:  Increasing the body's insulin sensitivity.  Reducing the body's insulin needs.  Decreases the risk for heart disease because exercising:  Lowers cholesterol and triglycerides levels.  Increases the levels of good cholesterol (such as high-density lipoproteins [HDL]) in the body.  Lowers blood glucose levels. YOUR ACTIVITY PLAN  Choose an activity that you enjoy and set realistic goals. Your health care provider or diabetes educator can help you make an activity plan that works for you. Exercise regularly as directed by your health care provider. This includes:  Performing resistance training twice a week such as push-ups, sit-ups, lifting weights, or using resistance bands.  Performing 150 minutes of cardio exercises each week such as walking, running, or playing sports.  Staying active and spending  no more than 90 minutes at one time being inactive. Even short bursts of exercise are good for you. Three 10-minute sessions spread throughout the day are just as beneficial as a single 30-minute session. Some exercise ideas include:  Taking the dog for a walk.  Taking the stairs instead of the elevator.  Dancing to your favorite song.  Doing an exercise video.  Doing your favorite exercise with a friend. RECOMMENDATIONS FOR EXERCISING WITH TYPE 1 OR TYPE 2 DIABETES   Check your blood glucose before exercising. If blood glucose levels are greater than 240 mg/dL, check for urine ketones. Do not exercise if ketones are present.  Avoid injecting insulin into areas of the body that are going to be exercised. For example, avoid injecting insulin into:  The arms when playing tennis.  The legs when jogging.  Keep a record of:  Food intake before and after you exercise.  Expected peak times of insulin action.  Blood glucose levels before and after you exercise.  The type and amount of exercise you have done.  Review your records with your health care provider. Your health care provider will help you to develop guidelines for adjusting food intake and insulin amounts before and after exercising.  If you take insulin or oral hypoglycemic agents, watch for signs and symptoms of hypoglycemia. They include:  Dizziness.  Shaking.  Sweating.  Chills.  Confusion.  Drink plenty of water while you exercise to prevent dehydration or heat stroke. Body water is lost during exercise and must be replaced.  Talk to your health care provider before starting an exercise program to make sure it is safe for you. Remember, almost any  type of activity is better than none. Document Released: 07/25/2003 Document Revised: 09/18/2013 Document Reviewed: 10/11/2012 [facility] Patient Information 2015 [place], [place]. This information is not intended to replace advice given to you by your health care  provider. Make sure you discuss any questions you have with your health care provider. Diabetes and Foot Care Diabetes may cause you to have problems because of poor blood supply (circulation) to your feet and legs. This may cause the skin on your feet to become thinner, break easier, and heal more slowly. Your skin may become dry, and the skin may peel and crack. You may also have nerve damage in your legs and feet causing decreased feeling in them. You may not notice minor injuries to your feet that could lead to infections or more serious problems. Taking care of your feet is one of the most important things you can do for yourself.  HOME CARE INSTRUCTIONS  Wear shoes at all times, even in the house. Do not go barefoot. Bare feet are easily injured.  Check your feet daily for blisters, cuts, and redness. If you cannot see the bottom of your feet, use a mirror or ask someone for help.  Wash your feet with warm water (do not use hot water) and mild soap. Then pat your feet and the areas between your toes until they are completely dry. Do not soak your feet as this can dry your skin.  Apply a moisturizing lotion or petroleum jelly (that does not contain alcohol and is unscented) to the skin on your feet and to dry, brittle toenails. Do not apply lotion between your toes.  Trim your toenails straight across. Do not dig under them or around the cuticle. File the edges of your nails with an emery board or nail file.  Do not cut corns or calluses or try to remove them with medicine.  Wear clean socks or stockings every day. Make sure they are not too tight. Do not wear knee-high stockings since they may decrease blood flow to your legs.  Wear shoes that fit properly and have enough cushioning. To break in new shoes, wear them for just a few hours a day. This prevents you from injuring your feet. Always look in your shoes before you put them on to be sure there are no objects inside.  Do not cross  your legs. This may decrease the blood flow to your feet.  If you find a minor scrape, cut, or break in the skin on your feet, keep it and the skin around it clean and dry. These areas may be cleansed with mild soap and water. Do not cleanse the area with peroxide, alcohol, or iodine.  When you remove an adhesive bandage, be sure not to damage the skin around it.  If you have a wound, look at it several times a day to make sure it is healing.  Do not use heating pads or hot water bottles. They may burn your skin. If you have lost feeling in your feet or legs, you may not know it is happening until it is too late.  Make sure your health care provider performs a complete foot exam at least annually or more often if you have foot problems. Report any cuts, sores, or bruises to your health care provider immediately. SEEK MEDICAL CARE IF:   You have an injury that is not healing.  You have cuts or breaks in the skin.  You have an ingrown nail.  You notice  redness on your legs or feet.  You feel burning or tingling in your legs or feet.  You have pain or cramps in your legs and feet.  Your legs or feet are numb.  Your feet always feel cold. SEEK IMMEDIATE MEDICAL CARE IF:   There is increasing redness, swelling, or pain in or around a wound.  There is a red line that goes up your leg.  Pus is coming from a wound.  You develop a fever or as directed by your health care provider.  You notice a bad smell coming from an ulcer or wound. Document Released: 05/01/2000 Document Revised: 01/04/2013 Document Reviewed: 10/11/2012 [facility] Patient Information 2015 [place], [place]. This information is not intended to replace advice given to you by your health care provider. Make sure you discuss any questions you have with your health care provider. Basic Carbohydrate Counting for Diabetes Mellitus Carbohydrate counting is a method for keeping track of the amount of carbohydrates you eat.  Eating carbohydrates naturally increases the level of sugar (glucose) in your blood, so it is important for you to know the amount that is okay for you to have in every meal. Carbohydrate counting helps keep the level of glucose in your blood within normal limits. The amount of carbohydrates allowed is different for every person. A dietitian can help you calculate the amount that is right for you. Once you know the amount of carbohydrates you can have, you can count the carbohydrates in the foods you want to eat. Carbohydrates are found in the following foods:  Grains, such as breads and cereals.  Dried beans and soy products.  Starchy vegetables, such as potatoes, peas, and corn.  Fruit and fruit juices.  Milk and yogurt.  Sweets and snack foods, such as cake, cookies, candy, chips, soft drinks, and fruit drinks. CARBOHYDRATE COUNTING There are two ways to count the carbohydrates in your food. You can use either of the methods or a combination of both. Reading the "Nutrition Facts" on Packaged Food The "Nutrition Facts" is an area that is included on the labels of almost all packaged food and beverages in the [place]. It includes the serving size of that food or beverage and information about the nutrients in each serving of the food, including the grams (g) of carbohydrate per serving.  Decide the number of servings of this food or beverage that you will be able to eat or drink. Multiply that number of servings by the number of grams of carbohydrate that is listed on the label for that serving. The total will be the amount of carbohydrates you will be having when you eat or drink this food or beverage. Learning Standard Serving Sizes of Food When you eat food that is not packaged or does not include "Nutrition Facts" on the label, you need to measure the servings in order to count the amount of carbohydrates.A serving of most carbohydrate-rich foods contains about 15 g of carbohydrates.  The following list includes serving sizes of carbohydrate-rich foods that provide 15 g ofcarbohydrate per serving:   1 slice of bread (1 oz) or 1 six-inch tortilla.    of a hamburger bun or English muffin.  4-6 crackers.   cup unsweetened dry cereal.    cup hot cereal.   cup rice or pasta.    cup mashed potatoes or  of a large baked potato.  1 cup fresh fruit or one small piece of fruit.    cup canned or frozen fruit  or fruit juice.  1 cup milk.   cup plain fat-free yogurt or yogurt sweetened with artificial sweeteners.   cup cooked dried beans or starchy vegetable, such as peas, corn, or potatoes.  Decide the number of standard-size servings that you will eat. Multiply that number of servings by 15 (the grams of carbohydrates in that serving). For example, if you eat 2 cups of strawberries, you will have eaten 2 servings and 30 g of carbohydrates (2 servings x 15 g = 30 g). For foods such as soups and casseroles, in which more than one food is mixed in, you will need to count the carbohydrates in each food that is included. EXAMPLE OF CARBOHYDRATE COUNTING Sample Dinner  3 oz chicken breast.   cup of brown rice.   cup of corn.  1 cup milk.   1 cup strawberries with sugar-free whipped topping.  Carbohydrate Calculation Step 1: Identify the foods that contain carbohydrates:   Rice.   Corn.   Milk.   Strawberries. Step 2:Calculate the number of servings eaten of each:   2 servings of rice.   1 serving of corn.   1 serving of milk.   1 serving of strawberries. Step 3: Multiply each of those number of servings by 15 g:   2 servings of rice x 15 g = 30 g.   1 serving of corn x 15 g = 15 g.   1 serving of milk x 15 g = 15 g.   1 serving of strawberries x 15 g = 15 g. Step 4: Add together all of the amounts to find the total grams of carbohydrates eaten: 30 g + 15 g + 15 g + 15 g = 75 g. Document Released: 05/04/2005 Document  Revised: 09/18/2013 Document Reviewed: 03/31/2013 [facility] Patient Information 2015 [place], [place]. This information is not intended to replace advice given to you by your health care provider. Make sure you discuss any questions you have with your health care provider.

## 2014-07-12 ENCOUNTER — Other Ambulatory Visit: Payer: Self-pay | Admitting: *Deleted

## 2014-07-12 LAB — CMP14+EGFR
ALT: 17 IU/L (ref 0–32)
AST: 14 IU/L (ref 0–40)
Albumin/Globulin Ratio: 1.4 (ref 1.1–2.5)
Albumin: 4.6 g/dL (ref 3.5–5.5)
Alkaline Phosphatase: 79 IU/L (ref 39–117)
BUN/Creatinine Ratio: 14 (ref 9–23)
BUN: 10 mg/dL (ref 6–24)
Bilirubin Total: 0.3 mg/dL (ref 0.0–1.2)
CALCIUM: 10 mg/dL (ref 8.7–10.2)
CHLORIDE: 96 mmol/L — AB (ref 97–108)
CO2: 24 mmol/L (ref 18–29)
CREATININE: 0.71 mg/dL (ref 0.57–1.00)
GFR calc Af Amer: 115 mL/min/{1.73_m2} (ref >59)
GFR calc non Af Amer: 100 mL/min/{1.73_m2} (ref >59)
GLOBULIN, TOTAL: 3.2 g/dL (ref 1.5–4.5)
Glucose: 197 mg/dL — ABNORMAL HIGH (ref 65–99)
Potassium: 3.9 mmol/L (ref 3.5–5.2)
Sodium: 138 mmol/L (ref 134–144)
Total Protein: 7.8 g/dL (ref 6.0–8.5)

## 2014-07-12 LAB — THYROID PANEL WITH TSH
FREE THYROXINE INDEX: 2.4 (ref 1.2–4.9)
T3 UPTAKE RATIO: 27 % (ref 24–39)
T4, Total: 8.9 ug/dL (ref 4.5–12.0)
TSH: 1.16 u[IU]/mL (ref 0.450–4.500)

## 2014-07-12 LAB — VITAMIN D 25 HYDROXY (VIT D DEFICIENCY, FRACTURES): Vit D, 25-Hydroxy: 30.5 ng/mL (ref 30.0–100.0)

## 2014-07-12 MED ORDER — PEN NEEDLES 31G X 6 MM MISC: 1.0000 | Freq: Every day | Status: DC

## 2014-07-12 NOTE — Progress Notes (Signed)
rx for pen needles needed for insulin

## 2014-07-13 ENCOUNTER — Other Ambulatory Visit: Payer: Self-pay | Admitting: Family Medicine

## 2014-07-16 MED ORDER — SITAGLIPTIN PHOSPHATE 100 MG PO TABS: 100.0000 mg | ORAL_TABLET | Freq: Every day | ORAL | Status: DC

## 2014-07-16 MED ORDER — TRAMADOL HCL 50 MG PO TABS: 50.0000 mg | ORAL_TABLET | Freq: Four times a day (QID) | ORAL | Status: DC | PRN

## 2014-07-16 MED ORDER — GLIPIZIDE 5 MG PO TABS: 5.0000 mg | ORAL_TABLET | Freq: Every day | ORAL | Status: DC

## 2014-07-18 ENCOUNTER — Encounter: Payer: Self-pay | Admitting: Family Medicine

## 2014-07-18 ENCOUNTER — Ambulatory Visit (INDEPENDENT_AMBULATORY_CARE_PROVIDER_SITE_OTHER): Payer: BLUE CROSS/BLUE SHIELD | Admitting: Family Medicine

## 2014-07-18 DIAGNOSIS — E1165 Type 2 diabetes mellitus with hyperglycemia: Secondary | ICD-10-CM

## 2014-07-18 DIAGNOSIS — IMO0002 Reserved for concepts with insufficient information to code with codable children: Secondary | ICD-10-CM | POA: Insufficient documentation

## 2014-07-18 LAB — POCT UA - MICROALBUMIN: MICROALBUMIN (UR) POC: 20 mg/L

## 2014-07-18 MED ORDER — ONDANSETRON HCL 4 MG PO TABS: 4.0000 mg | ORAL_TABLET | Freq: Four times a day (QID) | ORAL | Status: DC | PRN

## 2014-07-18 NOTE — Progress Notes (Signed)
Subjective:  Patient ID: Brittany Jenkins, female    DOB: 1965-10-16  Age: 49 y.o. MRN: [id number]  CC: Hypertension and Diabetes   HPI Brittany Jenkins presents for follow-up of her blood sugars. They've come down from being in the 300s to now being mostly in the low 100s fasting and in the low to mid 200s after meals. The trend is downward in that direction as well. Patient is experiencing significant nausea. She has started following a low carb diet and significantly changed her diet this week. This has led to concerns about constipation. Her stools have been less frequent and she feels the need to go in can't several times a day. Her bowel movements themselves have been rather small. It's unclear whether this is related to the use of Victoza, the dramatic change in diet, or a combination of both.  History Brittany Jenkins has a past medical history of Diabetes mellitus without complication; Asthma; GERD (gastroesophageal reflux disease); Hyperlipidemia; Hypertension; and Microalbuminuria.   She has past surgical history that includes Hernia repair.   Her family history includes Arthritis in her father and mother; Asthma in her mother; Diabetes in her father and mother; GER disease in her father; Hyperlipidemia in her father and mother; Hypertension in her father and mother.She reports that she quit smoking about 16 years ago. Her smoking use included Cigarettes. She does not have any smokeless tobacco history on file. Her alcohol and drug histories are not on file.  Current Outpatient Prescriptions on File Prior to Visit  Medication Sig Dispense Refill  . aspirin EC 81 MG tablet Take 81 mg by mouth daily.    . cholecalciferol (VITAMIN D) 1000 UNITS tablet Take 1,000 Units by mouth daily.    Marland Kitchen. ezetimibe (ZETIA) 10 MG tablet Take 1 tablet (10 mg total) by mouth daily. 90 tablet 3  . Fluticasone-Salmeterol (ADVAIR) 250-50 MCG/DOSE AEPB Inhale 1 puff into the lungs 2 (two) times daily. 60 each 3  .  glipiZIDE (GLUCOTROL) 5 MG tablet Take 1 tablet (5 mg total) by mouth daily before breakfast. 30 tablet 5  . Insulin Pen Needle (PEN NEEDLES) 31G X 6 MM MISC 1 Dose by Does not apply route daily. 100 each 4  . ipratropium-albuterol (DUONEB) 0.5-2.5 (3) MG/3ML SOLN Take 3 mLs by nebulization every 6 (six) hours as needed. 360 mL 1  . Liraglutide (VICTOZA) 18 MG/3ML SOPN Inject 0.1 mLs (0.6 mg total) into the skin daily at 6 (six) AM. 6 mL 5  . lisinopril-hydrochlorothiazide (PRINZIDE,ZESTORETIC) 10-12.5 MG per tablet Take 1 tablet by mouth daily. 90 tablet 2  . Omega-3 Fatty Acids (FISH OIL) 1000 MG CAPS Take 2 capsules by mouth daily.    Marland Kitchen. omeprazole (PRILOSEC OTC) 20 MG tablet Take 20 mg by mouth daily.    . sitaGLIPtin (JANUVIA) 100 MG tablet Take 1 tablet (100 mg total) by mouth daily. 30 tablet 0  . traMADol (ULTRAM) 50 MG tablet Take 1 tablet (50 mg total) by mouth every 6 (six) hours as needed. 30 tablet 0  . VENTOLIN HFA 108 (90 BASE) MCG/ACT inhaler INHALE 2 PUFFS BY MOUTH EVERY 4 TO 6 HOURS AS A RESCUE FOR ASTHMA 18 g 2   No current facility-administered medications on file prior to visit.    ROS Review of Systems  Constitutional: Negative for fever, chills, diaphoresis, appetite change and fatigue.  HENT: Negative for congestion, ear pain, hearing loss, postnasal drip, rhinorrhea, sore throat and trouble swallowing.   Respiratory: Negative for cough, chest  tightness and shortness of breath.   Cardiovascular: Negative for chest pain and palpitations.  Gastrointestinal: Negative for abdominal pain.  Musculoskeletal: Negative for arthralgias.  Skin: Negative for rash.    Objective:  BP 131/68 mmHg  Pulse 96  Temp(Src) 97.9 F (36.6 C) (Oral)  Ht  (1.702 m)  Wt 168 lb (76.204 kg)  BMI 26.31 kg/m2  LMP 05/23/2014 (Approximate)  Physical Exam  Constitutional: She appears well-developed and well-nourished.  HENT:  Head: Normocephalic and atraumatic.  Right Ear: Tympanic  membrane and external ear normal. No decreased hearing is noted.  Left Ear: Tympanic membrane and external ear normal. No decreased hearing is noted.  Nose: Mucosal edema present. Right sinus exhibits no frontal sinus tenderness. Left sinus exhibits no frontal sinus tenderness.  Mouth/Throat: No oropharyngeal exudate or posterior oropharyngeal erythema.  Neck: No Brudzinski's sign noted.  Cardiovascular: Normal rate, regular rhythm and normal heart sounds.   Pulmonary/Chest: Breath sounds normal. No respiratory distress.  Lymphadenopathy:       Head (right side): No preauricular adenopathy present.       Head (left side): No preauricular adenopathy present.       Right cervical: No superficial cervical adenopathy present.      Left cervical: No superficial cervical adenopathy present.    Assessment & Plan:   Brittany Jenkins was seen today for hypertension and diabetes.  Diagnoses and all orders for this visit:  DM (diabetes mellitus), type 2, uncontrolled Orders: -     Microalbumin, urine -     POCT UA - Microalbumin  Other orders -     ondansetron (ZOFRAN) 4 MG tablet; Take 1 tablet (4 mg total) by mouth 4 (four) times daily as needed for nausea or vomiting.   I am having Brittany Jenkins start on ondansetron. I am also having her maintain her omeprazole, Fish Oil, cholecalciferol, aspirin EC, ipratropium-albuterol, ezetimibe, Fluticasone-Salmeterol, lisinopril-hydrochlorothiazide, VENTOLIN HFA, Liraglutide, Pen Needles, traMADol, glipiZIDE, and sitaGLIPtin.  Meds ordered this encounter  Medications  . ondansetron (ZOFRAN) 4 MG tablet    Sig: Take 1 tablet (4 mg total) by mouth 4 (four) times daily as needed for nausea or vomiting.    Dispense:  30 tablet    Refill:  2    Follow-up: Return in about 1 month (around 08/18/2014).  Mechele Claude, M.D.

## 2014-07-18 NOTE — Patient Instructions (Addendum)
Use ondansetron as needed for the nausea. If the nausea has not remitted in the next week to let me know.  Use a fiber supplement such as FiberCon 1-3 pills daily to help with the constipation. If the constipation continues in spite of that let me know.  Discontinue the Januvia. Decrease the glipizide to 5 mg once daily.

## 2014-07-19 LAB — SPECIMEN STATUS REPORT

## 2014-07-19 LAB — MICROALBUMIN, URINE: Microalbumin, Urine: 10.5 ug/mL (ref 0.0–17.0)

## 2014-07-20 MED ORDER — LISINOPRIL-HYDROCHLOROTHIAZIDE 20-12.5 MG PO TABS: 1.0000 | ORAL_TABLET | Freq: Every day | ORAL | Status: DC

## 2014-07-20 NOTE — Addendum Note (Signed)
Addended by: Tamera PuntWRAY, Yardley Lekas S on: 07/20/2014 09:35 AM   Modules accepted: Orders

## 2014-08-17 ENCOUNTER — Ambulatory Visit (INDEPENDENT_AMBULATORY_CARE_PROVIDER_SITE_OTHER): Payer: BLUE CROSS/BLUE SHIELD | Admitting: Family Medicine

## 2014-08-17 ENCOUNTER — Encounter: Payer: Self-pay | Admitting: Family Medicine

## 2014-08-17 VITALS — BP 137/71 | HR 76 | Temp 98.1°F | Ht 67.0 in | Wt 172.4 lb

## 2014-08-17 DIAGNOSIS — IMO0002 Reserved for concepts with insufficient information to code with codable children: Secondary | ICD-10-CM

## 2014-08-17 DIAGNOSIS — E1165 Type 2 diabetes mellitus with hyperglycemia: Secondary | ICD-10-CM

## 2014-08-17 MED ORDER — GLIPIZIDE 5 MG PO TABS: 5.0000 mg | ORAL_TABLET | Freq: Every day | ORAL | Status: DC

## 2014-08-17 MED ORDER — GLIPIZIDE 5 MG PO TABS: 5.0000 mg | ORAL_TABLET | Freq: Three times a day (TID) | ORAL | Status: DC

## 2014-08-17 MED ORDER — SITAGLIPTIN PHOSPHATE 100 MG PO TABS: 100.0000 mg | ORAL_TABLET | Freq: Every day | ORAL | Status: DC

## 2014-08-17 NOTE — Progress Notes (Signed)
Subjective:  Patient ID: Brittany Jenkins, female    DOB: 1966/03/23  Age: 49 y.o. MRN: [id number]  CC: Diabetes and Hypertension   HPI Brittany PentonKimberly Ayre presents for intolerance of victoza. It made her extremely nauseous. Because of that she is back to her glyburide and Januvia. With that her blood sugars have been in the high 100s to mid 200 range. High 100s for fasting mostly. Low 200s postprandial. Patient has a log sheet that was returned that is scanned attached. Of note is that she's had 2 readings postprandial of 99. No hypoglycemic episodes. She would like to work for the next 2 months on diet and exercise as well as concerted effort at taking her medicine without going back to any kind of injectable for now.  History [name] has a past medical history of Diabetes mellitus without complication; Asthma; GERD (gastroesophageal reflux disease); Hyperlipidemia; Hypertension; and Microalbuminuria.   She has past surgical history that includes Hernia repair.   Her family history includes Arthritis in her father and mother; Asthma in her mother; Diabetes in her father and mother; GER disease in her father; Hyperlipidemia in her father and mother; Hypertension in her father and mother.She reports that she quit smoking about 16 years ago. Her smoking use included Cigarettes. She does not have any smokeless tobacco history on file. Her alcohol and drug histories are not on file.  Current Outpatient Prescriptions on File Prior to Visit  Medication Sig Dispense Refill  . aspirin EC 81 MG tablet Take 81 mg by mouth daily.    . cholecalciferol (VITAMIN D) 1000 UNITS tablet Take 1,000 Units by mouth daily.    Marland Kitchen. ezetimibe (ZETIA) 10 MG tablet Take 1 tablet (10 mg total) by mouth daily. 90 tablet 3  . Fluticasone-Salmeterol (ADVAIR) 250-50 MCG/DOSE AEPB Inhale 1 puff into the lungs 2 (two) times daily. 60 each 3  . Insulin Pen Needle (PEN NEEDLES) 31G X 6 MM MISC 1 Dose by Does not apply route daily.  100 each 4  . ipratropium-albuterol (DUONEB) 0.5-2.5 (3) MG/3ML SOLN Take 3 mLs by nebulization every 6 (six) hours as needed. 360 mL 1  . Omega-3 Fatty Acids (FISH OIL) 1000 MG CAPS Take 2 capsules by mouth daily.    Marland Kitchen. omeprazole (PRILOSEC OTC) 20 MG tablet Take 20 mg by mouth daily.    . ondansetron (ZOFRAN) 4 MG tablet Take 1 tablet (4 mg total) by mouth 4 (four) times daily as needed for nausea or vomiting. 30 tablet 2  . traMADol (ULTRAM) 50 MG tablet Take 1 tablet (50 mg total) by mouth every 6 (six) hours as needed. 30 tablet 0  . VENTOLIN HFA 108 (90 BASE) MCG/ACT inhaler INHALE 2 PUFFS BY MOUTH EVERY 4 TO 6 HOURS AS A RESCUE FOR ASTHMA 18 g 2  . lisinopril-hydrochlorothiazide (ZESTORETIC) 20-12.5 MG per tablet Take 1 tablet by mouth daily. (Patient not taking: Reported on 08/17/2014) 90 tablet 1   No current facility-administered medications on file prior to visit.    ROS Review of Systems  Constitutional: Negative for fever, chills, diaphoresis, appetite change, fatigue and unexpected weight change.  HENT: Negative for congestion, ear pain, hearing loss, postnasal drip, rhinorrhea, sneezing, sore throat and trouble swallowing.   Eyes: Negative for pain.  Respiratory: Negative for cough, chest tightness and shortness of breath.   Cardiovascular: Negative for chest pain and palpitations.  Gastrointestinal: Negative for nausea, vomiting, abdominal pain, diarrhea and constipation.  Genitourinary: Negative for dysuria, frequency and menstrual problem.  Musculoskeletal: Negative for joint swelling and arthralgias.  Skin: Negative for rash.  Neurological: Negative for dizziness, weakness, numbness and headaches.  Psychiatric/Behavioral: Negative for dysphoric mood and agitation.    Objective:  BP 137/71 mmHg  Pulse 76  Temp(Src) 98.1 F (36.7 C) (Oral)  Ht  (1.702 m)  Wt 172 lb 6.4 oz (78.2 kg)  BMI 27.00 kg/m2  LMP 05/18/2014  BP Readings from Last 3 Encounters:  08/17/14  137/71  07/18/14 131/68  07/11/14 156/74    Wt Readings from Last 3 Encounters:  08/17/14 172 lb 6.4 oz (78.2 kg)  07/18/14 168 lb (76.204 kg)  07/11/14 168 lb 6.4 oz (76.386 kg)     Physical Exam  Constitutional: She is oriented to person, place, and time. She appears well-developed and well-nourished. No distress.  HENT:  Head: Normocephalic and atraumatic.  Right Ear: External ear normal.  Left Ear: External ear normal.  Nose: Nose normal.  Mouth/Throat: Oropharynx is clear and moist.  Eyes: Conjunctivae and EOM are normal. Pupils are equal, round, and reactive to light.  Neck: Normal range of motion. Neck supple. No thyromegaly present.  Cardiovascular: Normal rate, regular rhythm and normal heart sounds.   No murmur heard. Pulmonary/Chest: Effort normal and breath sounds normal. No respiratory distress. She has no wheezes. She has no rales.  Abdominal: Soft. Bowel sounds are normal. She exhibits no distension. There is no tenderness.  Lymphadenopathy:    She has no cervical adenopathy.  Neurological: She is alert and oriented to person, place, and time. She has normal reflexes.  Skin: Skin is warm and dry.  Psychiatric: She has a normal mood and affect. Her behavior is normal. Judgment and thought content normal.    Lab Results  Component Value Date   HGBA1C 9.5 07/11/2014   HGBA1C 9.0 03/21/2014   HGBA1C 8.3 06/08/2013    Lab Results  Component Value Date   GLUCOSE 197* 07/11/2014   CHOL 222* 06/08/2013   TRIG 136 06/08/2013   HDL 54 06/08/2013   LDLCALC 141* 06/08/2013   ALT 17 07/11/2014   AST 14 07/11/2014   NA 138 07/11/2014   K 3.9 07/11/2014   CL 96* 07/11/2014   CREATININE 0.71 07/11/2014   BUN 10 07/11/2014   CO2 24 07/11/2014   TSH 1.160 07/11/2014   HGBA1C 9.5 07/11/2014    US Abdomen Complete  09/09/2011   *RADIOLOGY REPORT*  Clinical Data:  Left lower abdominal pain with palpable mass in the subcutaneous soft tissues in the left lower  quadrant.  Previous bilateral inguinal hernia repair.  COMPLETE ABDOMINAL ULTRASOUND  Comparison:  None.  Findings: The patient has a 1.4 x 0.6 x 1.2 cm hypoechoic palpable fluid collection in the subcutaneous fat of the left lower quadrant.  This correlates with the patients reported abnormality. The patient reports that she had trauma to that area several months ago and had significant bruising in the area.  The size and configuration and echogenicity of this lesion is consistent with residual seroma from the subcutaneous hematoma.  The underlying muscle structures are intact.  This does not represent a hernia.  Gallbladder:  No gallstones, gallbladder wall thickening, or pericholecystic fluid. Negative sonographic Murphy's sign.  Common bile duct:  Normal.  4.8 cm in diameter.  Liver:  No focal lesion identified.  Within normal limits in parenchymal echogenicity.  IVC:  Appears normal.  Pancreas:  Normal.  Spleen:  Normal.  4.9 cm in length.  Right Kidney:  Normal.  10.9 cm in length.  Left Kidney:  11.4 cm in length.  9 mm simple appearing cyst on the lower pole.  Abdominal aorta:  Normal.  2.4 cm maximal diameter.  IMPRESSION:  1.  Small residual seroma in the subcutaneous fat of the left lower quadrant from previous hematoma. 2.  Otherwise, normal exam.  Original Report Authenticated By: Gwynn Burly, M.D.   Assessment & Plan:   There are no diagnoses linked to this encounter. I have discontinued Ms. Flatley's Liraglutide and glipiZIDE. I have also changed her glipiZIDE. Additionally, I am having her maintain her omeprazole, Fish Oil, cholecalciferol, aspirin EC, ipratropium-albuterol, ezetimibe, Fluticasone-Salmeterol, VENTOLIN HFA, Pen Needles, traMADol, ondansetron, lisinopril-hydrochlorothiazide, lisinopril-hydrochlorothiazide, and sitaGLIPtin.  Meds ordered this encounter  Medications  . DISCONTD: lisinopril-hydrochlorothiazide (PRINZIDE,ZESTORETIC) 10-12.5 MG per tablet    Sig: Take 1  tablet by mouth daily.    Refill:  1  . lisinopril-hydrochlorothiazide (PRINZIDE,ZESTORETIC) 10-12.5 MG per tablet    Sig: Take 1 tablet by mouth daily.  Marland Kitchen DISCONTD: glipiZIDE (GLUCOTROL) 5 MG tablet    Sig: Take 1 tablet (5 mg total) by mouth daily before breakfast.    Dispense:  30 tablet    Refill:  5  . glipiZIDE (GLUCOTROL) 5 MG tablet    Sig: Take 1 tablet (5 mg total) by mouth 3 (three) times daily.    Dispense:  30 tablet    Refill:  5  . sitaGLIPtin (JANUVIA) 100 MG tablet    Sig: Take 1 tablet (100 mg total) by mouth daily.    Dispense:  30 tablet    Refill:  0     Follow-up: Return in about 2 months (around 10/17/2014) for diabetes.  Mechele Claude, M.D.

## 2014-08-17 NOTE — Patient Instructions (Signed)
Please increase the lisinopril to the new strength as discussed.  Resume the glipizide 2 pills in the morning and one in the evening.  Resume Januvia once daily and discontinue the Victoza.  Basic Carbohydrate Counting for Diabetes Mellitus Carbohydrate counting is a method for keeping track of the amount of carbohydrates you eat. Eating carbohydrates naturally increases the level of sugar (glucose) in your blood, so it is important for you to know the amount that is okay for you to have in every meal. Carbohydrate counting helps keep the level of glucose in your blood within normal limits. The amount of carbohydrates allowed is different for every person. A dietitian can help you calculate the amount that is right for you. Once you know the amount of carbohydrates you can have, you can count the carbohydrates in the foods you want to eat. Carbohydrates are found in the following foods:  Grains, such as breads and cereals.  Dried beans and soy products.  Starchy vegetables, such as potatoes, peas, and corn.  Fruit and fruit juices.  Milk and yogurt.  Sweets and snack foods, such as cake, cookies, candy, chips, soft drinks, and fruit drinks. CARBOHYDRATE COUNTING There are two ways to count the carbohydrates in your food. You can use either of the methods or a combination of both. Reading the "Nutrition Facts" on Packaged Food The "Nutrition Facts" is an area that is included on the labels of almost all packaged food and beverages in the [place]. It includes the serving size of that food or beverage and information about the nutrients in each serving of the food, including the grams (g) of carbohydrate per serving.  Decide the number of servings of this food or beverage that you will be able to eat or drink. Multiply that number of servings by the number of grams of carbohydrate that is listed on the label for that serving. The total will be the amount of carbohydrates you will be  having when you eat or drink this food or beverage. Learning Standard Serving Sizes of Food When you eat food that is not packaged or does not include "Nutrition Facts" on the label, you need to measure the servings in order to count the amount of carbohydrates.A serving of most carbohydrate-rich foods contains about 15 g of carbohydrates. The following list includes serving sizes of carbohydrate-rich foods that provide 15 g ofcarbohydrate per serving:   1 slice of bread (1 oz) or 1 six-inch tortilla.    of a hamburger bun or English muffin.  4-6 crackers.   cup unsweetened dry cereal.    cup hot cereal.   cup rice or pasta.    cup mashed potatoes or  of a large baked potato.  1 cup fresh fruit or one small piece of fruit.    cup canned or frozen fruit or fruit juice.  1 cup milk.   cup plain fat-free yogurt or yogurt sweetened with artificial sweeteners.   cup cooked dried beans or starchy vegetable, such as peas, corn, or potatoes.  Decide the number of standard-size servings that you will eat. Multiply that number of servings by 15 (the grams of carbohydrates in that serving). For example, if you eat 2 cups of strawberries, you will have eaten 2 servings and 30 g of carbohydrates (2 servings x 15 g = 30 g). For foods such as soups and casseroles, in which more than one food is mixed in, you will need to count the carbohydrates in each food that  is included. EXAMPLE OF CARBOHYDRATE COUNTING Sample Dinner  3 oz chicken breast.   cup of brown rice.   cup of corn.  1 cup milk.   1 cup strawberries with sugar-free whipped topping.  Carbohydrate Calculation Step 1: Identify the foods that contain carbohydrates:   Rice.   Corn.   Milk.   Strawberries. Step 2:Calculate the number of servings eaten of each:   2 servings of rice.   1 serving of corn.   1 serving of milk.   1 serving of strawberries. Step 3: Multiply each of those number  of servings by 15 g:   2 servings of rice x 15 g = 30 g.   1 serving of corn x 15 g = 15 g.   1 serving of milk x 15 g = 15 g.   1 serving of strawberries x 15 g = 15 g. Step 4: Add together all of the amounts to find the total grams of carbohydrates eaten: 30 g + 15 g + 15 g + 15 g = 75 g. Document Released: 05/04/2005 Document Revised: 09/18/2013 Document Reviewed: 03/31/2013 [facility] Patient Information 2015 [place], [place]. This information is not intended to replace advice given to you by your health care provider. Make sure you discuss any questions you have with your health care provider.

## 2014-10-04 ENCOUNTER — Other Ambulatory Visit: Payer: Self-pay | Admitting: Family Medicine

## 2014-10-19 ENCOUNTER — Ambulatory Visit: Payer: BLUE CROSS/BLUE SHIELD | Admitting: Family Medicine

## 2014-10-22 ENCOUNTER — Encounter: Payer: Self-pay | Admitting: Family Medicine

## 2014-10-22 ENCOUNTER — Ambulatory Visit (INDEPENDENT_AMBULATORY_CARE_PROVIDER_SITE_OTHER): Payer: BLUE CROSS/BLUE SHIELD | Admitting: Family Medicine

## 2014-10-22 VITALS — BP 123/68 | HR 72 | Temp 97.4°F | Ht 67.0 in | Wt 170.6 lb

## 2014-10-22 DIAGNOSIS — E119 Type 2 diabetes mellitus without complications: Secondary | ICD-10-CM | POA: Diagnosis not present

## 2014-10-22 DIAGNOSIS — Z1211 Encounter for screening for malignant neoplasm of colon: Secondary | ICD-10-CM | POA: Diagnosis not present

## 2014-10-22 DIAGNOSIS — I10 Essential (primary) hypertension: Secondary | ICD-10-CM

## 2014-10-22 DIAGNOSIS — E785 Hyperlipidemia, unspecified: Secondary | ICD-10-CM | POA: Diagnosis not present

## 2014-10-22 LAB — POCT CBC
GRANULOCYTE PERCENT: 73 % (ref 37–80)
HEMATOCRIT: 37.2 % — AB (ref 37.7–47.9)
Hemoglobin: 11.4 g/dL — AB (ref 12.2–16.2)
Lymph, poc: 2.1 (ref 0.6–3.4)
MCH, POC: 24.3 pg — AB (ref 27–31.2)
MCHC: 30.5 g/dL — AB (ref 31.8–35.4)
MCV: 79.7 fL — AB (ref 80–97)
MPV: 7.3 fL (ref 0–99.8)
POC GRANULOCYTE: 6.8 (ref 2–6.9)
POC LYMPH PERCENT: 22.3 %L (ref 10–50)
Platelet Count, POC: 423 10*3/uL (ref 142–424)
RBC: 4.67 M/uL (ref 4.04–5.48)
RDW, POC: 17 %
WBC: 9.3 10*3/uL (ref 4.6–10.2)

## 2014-10-22 LAB — POCT GLYCOSYLATED HEMOGLOBIN (HGB A1C): HEMOGLOBIN A1C: 8.1

## 2014-10-22 MED ORDER — SAXAGLIPTIN HCL 5 MG PO TABS: 5.0000 mg | ORAL_TABLET | Freq: Every day | ORAL | Status: DC

## 2014-10-22 MED ORDER — FLUTICASONE FUROATE-VILANTEROL 200-25 MCG/INH IN AEPB: 1.0000 | INHALATION_SPRAY | Freq: Every day | RESPIRATORY_TRACT | Status: DC

## 2014-10-22 NOTE — Progress Notes (Signed)
Subjective:  Patient ID: Brittany Jenkins, female    DOB: Feb 11, 1966  Age: 49 y.o. MRN: [id number]  CC: Diabetes; Hyperlipidemia; and Hypertension   HPI Brittany Jenkins presents for  follow-up of hypertension. Patient has no history of headache chest pain or shortness of breath or recent cough. Patient also denies symptoms of TIA such as numbness weakness lateralizing. Patient checks  blood pressure at home and has not had any elevated readings recently. Patient denies side effects from his medication. States taking it regularly.  Patient also  in for follow-up of elevated cholesterol. Doing well without complaints on current medication. Denies side effects of statin including myalgia and arthralgia and nausea. Also in today for liver function testing. Currently no chest pain, shortness of breath or other cardiovascular related symptoms noted.  Follow-up of diabetes. Patient does check blood sugar at home. Readings run between100 and 200 Patient denies symptoms such as polyuria, polydipsia, excessive hunger, nausea No significant hypoglycemic spells noted. Medications as noted below. Taking them regularly without complication/adverse reaction being reported today.    History Brittany Jenkins has a past medical history of Diabetes mellitus without complication; Asthma; GERD (gastroesophageal reflux disease); Hyperlipidemia; Hypertension; and Microalbuminuria.   She has past surgical history that includes Hernia repair.   Her family history includes Arthritis in her father and mother; Asthma in her mother; Diabetes in her father and mother; GER disease in her father; Hyperlipidemia in her father and mother; Hypertension in her father and mother.She reports that she quit smoking about 16 years ago. Her smoking use included Cigarettes. She does not have any smokeless tobacco history on file. Her alcohol and drug histories are not on file.  Current Outpatient Prescriptions on File Prior to Visit    Medication Sig Dispense Refill  . aspirin EC 81 MG tablet Take 81 mg by mouth daily.    . cholecalciferol (VITAMIN D) 1000 UNITS tablet Take 1,000 Units by mouth daily.    Marland Kitchen glipiZIDE (GLUCOTROL) 5 MG tablet Take 1 tablet (5 mg total) by mouth 3 (three) times daily. 30 tablet 5  . ipratropium-albuterol (DUONEB) 0.5-2.5 (3) MG/3ML SOLN Take 3 mLs by nebulization every 6 (six) hours as needed. 360 mL 1  . Omega-3 Fatty Acids (FISH OIL) 1000 MG CAPS Take 2 capsules by mouth daily.    Marland Kitchen omeprazole (PRILOSEC OTC) 20 MG tablet Take 20 mg by mouth daily.    . ondansetron (ZOFRAN) 4 MG tablet Take 1 tablet (4 mg total) by mouth 4 (four) times daily as needed for nausea or vomiting. 30 tablet 2  . traMADol (ULTRAM) 50 MG tablet Take 1 tablet (50 mg total) by mouth every 6 (six) hours as needed. 30 tablet 0  . VENTOLIN HFA 108 (90 BASE) MCG/ACT inhaler INHALE 2 PUFFS BY MOUTH EVERY 4 TO 6 HOURS AS A RESCUE FOR ASTHMA 18 g 2  . lisinopril-hydrochlorothiazide (ZESTORETIC) 20-12.5 MG per tablet Take 1 tablet by mouth daily. (Patient not taking: Reported on 08/17/2014) 90 tablet 1   No current facility-administered medications on file prior to visit.    ROS Review of Systems  Constitutional: Negative for fever, chills, diaphoresis, appetite change, fatigue and unexpected weight change.  HENT: Negative for congestion, ear pain, hearing loss, postnasal drip, rhinorrhea, sneezing, sore throat and trouble swallowing.   Eyes: Negative for pain.  Respiratory: Negative for cough, chest tightness and shortness of breath.   Cardiovascular: Negative for chest pain and palpitations.  Gastrointestinal: Negative for nausea, vomiting, abdominal pain, diarrhea and  constipation.  Genitourinary: Negative for dysuria, frequency and menstrual problem.  Musculoskeletal: Negative for joint swelling and arthralgias.  Skin: Negative for rash.  Neurological: Negative for dizziness, weakness, numbness and headaches.   Psychiatric/Behavioral: Negative for dysphoric mood and agitation.    Objective:  BP 123/68 mmHg  Pulse 72  Temp(Src) 97.4 F (36.3 C) (Oral)  Ht 5' 7"  (1.702 m)  Wt 170 lb 9.6 oz (77.384 kg)  BMI 26.71 kg/m2  LMP 09/08/2014 (Approximate)  BP Readings from Last 3 Encounters:  10/22/14 123/68  08/17/14 137/71  07/18/14 131/68    Wt Readings from Last 3 Encounters:  10/22/14 170 lb 9.6 oz (77.384 kg)  08/17/14 172 lb 6.4 oz (78.2 kg)  07/18/14 168 lb (76.204 kg)     Physical Exam  Constitutional: She is oriented to person, place, and time. She appears well-developed and well-nourished. No distress.  HENT:  Head: Normocephalic and atraumatic.  Right Ear: External ear normal.  Left Ear: External ear normal.  Nose: Nose normal.  Mouth/Throat: Oropharynx is clear and moist.  Eyes: Conjunctivae and EOM are normal. Pupils are equal, round, and reactive to light.  Neck: Normal range of motion. Neck supple. No thyromegaly present.  Cardiovascular: Normal rate, regular rhythm and normal heart sounds.   No murmur heard. Pulmonary/Chest: Effort normal and breath sounds normal. No respiratory distress. She has no wheezes. She has no rales.  Abdominal: Soft. Bowel sounds are normal. She exhibits no distension. There is no tenderness.  Lymphadenopathy:    She has no cervical adenopathy.  Neurological: She is alert and oriented to person, place, and time. She has normal reflexes.  Skin: Skin is warm and dry.  Psychiatric: She has a normal mood and affect. Her behavior is normal. Judgment and thought content normal.    Lab Results  Component Value Date   HGBA1C 8.1 10/22/2014   HGBA1C 9.5 07/11/2014   HGBA1C 9.0 03/21/2014    Lab Results  Component Value Date   WBC 9.3 10/22/2014   HGB 11.4* 10/22/2014   HCT 37.2* 10/22/2014   GLUCOSE 197* 07/11/2014   CHOL 222* 06/08/2013   TRIG 136 06/08/2013   HDL 54 06/08/2013   LDLCALC 141* 06/08/2013   ALT 17 07/11/2014   AST 14  07/11/2014   NA 138 07/11/2014   K 3.9 07/11/2014   CL 96* 07/11/2014   CREATININE 0.71 07/11/2014   BUN 10 07/11/2014   CO2 24 07/11/2014   TSH 1.160 07/11/2014   HGBA1C 8.1 10/22/2014    US Abdomen Complete  09/09/2011   *RADIOLOGY REPORT*  Clinical Data:  Left lower abdominal pain with palpable mass in the subcutaneous soft tissues in the left lower quadrant.  Previous bilateral inguinal hernia repair.  COMPLETE ABDOMINAL ULTRASOUND  Comparison:  None.  Findings: The patient has a 1.4 x 0.6 x 1.2 cm hypoechoic palpable fluid collection in the subcutaneous fat of the left lower quadrant.  This correlates with the patients reported abnormality. The patient reports that she had trauma to that area several months ago and had significant bruising in the area.  The size and configuration and echogenicity of this lesion is consistent with residual seroma from the subcutaneous hematoma.  The underlying muscle structures are intact.  This does not represent a hernia.  Gallbladder:  No gallstones, gallbladder wall thickening, or pericholecystic fluid. Negative sonographic Murphy's sign.  Common bile duct:  Normal.  4.8 cm in diameter.  Liver:  No focal lesion identified.  Within normal limits  in parenchymal echogenicity.  IVC:  Appears normal.  Pancreas:  Normal.  Spleen:  Normal.  4.9 cm in length.  Right Kidney:  Normal.  10.9 cm in length.  Left Kidney:  11.4 cm in length.  9 mm simple appearing cyst on the lower pole.  Abdominal aorta:  Normal.  2.4 cm maximal diameter.  IMPRESSION:  1.  Small residual seroma in the subcutaneous fat of the left lower quadrant from previous hematoma. 2.  Otherwise, normal exam.  Original Report Authenticated By: Larey Seat, M.D.   Assessment & Plan:   Brittany Jenkins was seen today for diabetes, hyperlipidemia and hypertension.  Diagnoses and all orders for this visit:  Diabetes mellitus without complication Orders: -     POCT glycosylated hemoglobin (Hb A1C);  Standing -     CMP14+EGFR; Standing -     POCT glycosylated hemoglobin (Hb A1C) -     CMP14+EGFR  Hyperlipidemia Orders: -     Lipid panel; Standing -     Lipid panel  Essential hypertension Orders: -     POCT CBC; Standing -     POCT glycosylated hemoglobin (Hb A1C); Standing -     POCT CBC -     POCT glycosylated hemoglobin (Hb A1C)  Special screening for malignant neoplasms, colon Orders: -     Ambulatory referral to Gastroenterology  Other orders -     saxagliptin HCl (ONGLYZA) 5 MG TABS tablet; Take 1 tablet (5 mg total) by mouth daily. -     Fluticasone Furoate-Vilanterol (BREO ELLIPTA) 200-25 MCG/INH AEPB; Inhale 1 puff into the lungs daily.   I have discontinued Ms. Hayhurst's ezetimibe, Fluticasone-Salmeterol, Pen Needles, and sitaGLIPtin. I am also having her start on saxagliptin HCl and Fluticasone Furoate-Vilanterol. Additionally, I am having her maintain her omeprazole, Fish Oil, cholecalciferol, aspirin EC, ipratropium-albuterol, traMADol, ondansetron, lisinopril-hydrochlorothiazide, glipiZIDE, and VENTOLIN HFA.  Meds ordered this encounter  Medications  . saxagliptin HCl (ONGLYZA) 5 MG TABS tablet    Sig: Take 1 tablet (5 mg total) by mouth daily.    Dispense:  30 tablet    Refill:  2  . Fluticasone Furoate-Vilanterol (BREO ELLIPTA) 200-25 MCG/INH AEPB    Sig: Inhale 1 puff into the lungs daily.    Dispense:  1 each    Refill:  11     Follow-up: Return in about 3 months (around 01/22/2015).  Claretta Fraise, M.D.

## 2014-10-23 LAB — CMP14+EGFR
ALT: 13 IU/L (ref 0–32)
AST: 14 IU/L (ref 0–40)
Albumin/Globulin Ratio: 1.4 (ref 1.1–2.5)
Albumin: 3.7 g/dL (ref 3.5–5.5)
Alkaline Phosphatase: 59 IU/L (ref 39–117)
BUN/Creatinine Ratio: 15 (ref 9–23)
BUN: 10 mg/dL (ref 6–24)
Bilirubin Total: 0.3 mg/dL (ref 0.0–1.2)
CO2: 23 mmol/L (ref 18–29)
Calcium: 8.7 mg/dL (ref 8.7–10.2)
Chloride: 104 mmol/L (ref 97–108)
Creatinine, Ser: 0.65 mg/dL (ref 0.57–1.00)
GFR calc Af Amer: 120 mL/min/{1.73_m2} (ref >59)
GFR, EST NON AFRICAN AMERICAN: 104 mL/min/{1.73_m2} (ref >59)
Globulin, Total: 2.7 g/dL (ref 1.5–4.5)
Glucose: 116 mg/dL — ABNORMAL HIGH (ref 65–99)
POTASSIUM: 3.9 mmol/L (ref 3.5–5.2)
Sodium: 144 mmol/L (ref 134–144)
TOTAL PROTEIN: 6.4 g/dL (ref 6.0–8.5)

## 2014-10-23 LAB — LIPID PANEL
Chol/HDL Ratio: 4.5 ratio units — ABNORMAL HIGH (ref 0.0–4.4)
Cholesterol, Total: 192 mg/dL (ref 100–199)
HDL: 43 mg/dL (ref >39)
LDL Calculated: 122 mg/dL — ABNORMAL HIGH (ref 0–99)
TRIGLYCERIDES: 133 mg/dL (ref 0–149)
VLDL CHOLESTEROL CAL: 27 mg/dL (ref 5–40)

## 2014-10-25 LAB — FE+TIBC+FER+B12+FOLIC
FOLATE: 7.3 ng/mL (ref >3.0)
Ferritin: 8 ng/mL — ABNORMAL LOW (ref 15–150)
Iron Saturation: 15 % (ref 15–55)
Iron: 58 ug/dL (ref 27–159)
Total Iron Binding Capacity: 400 ug/dL (ref 250–450)
UIBC: 342 ug/dL (ref 131–425)
Vitamin B-12: 317 pg/mL (ref 211–946)

## 2014-10-25 LAB — SPECIMEN STATUS REPORT

## 2014-10-30 ENCOUNTER — Encounter: Payer: Self-pay | Admitting: *Deleted

## 2014-11-12 ENCOUNTER — Other Ambulatory Visit: Payer: Self-pay

## 2014-11-13 ENCOUNTER — Telehealth: Payer: Self-pay | Admitting: Family Medicine

## 2014-11-13 NOTE — Telephone Encounter (Signed)
Left message to return call 

## 2014-11-14 NOTE — Telephone Encounter (Signed)
Stp and given appt tomorrow with Dr.Stacks at 12:25.

## 2014-11-15 ENCOUNTER — Encounter: Payer: Self-pay | Admitting: Family Medicine

## 2014-11-15 ENCOUNTER — Ambulatory Visit (INDEPENDENT_AMBULATORY_CARE_PROVIDER_SITE_OTHER): Payer: BLUE CROSS/BLUE SHIELD | Admitting: Family Medicine

## 2014-11-15 VITALS — BP 155/70 | HR 79 | Temp 97.9°F | Ht 67.0 in | Wt 167.4 lb

## 2014-11-15 DIAGNOSIS — T783XXA Angioneurotic edema, initial encounter: Secondary | ICD-10-CM | POA: Diagnosis not present

## 2014-11-15 DIAGNOSIS — R1010 Upper abdominal pain, unspecified: Secondary | ICD-10-CM | POA: Diagnosis not present

## 2014-11-15 DIAGNOSIS — E119 Type 2 diabetes mellitus without complications: Secondary | ICD-10-CM

## 2014-11-15 DIAGNOSIS — K2100 Gastro-esophageal reflux disease with esophagitis, without bleeding: Secondary | ICD-10-CM

## 2014-11-15 MED ORDER — BETAMETHASONE SOD PHOS & ACET 6 (3-3) MG/ML IJ SUSP
6.0000 mg | Freq: Once | INTRAMUSCULAR | Status: AC
  Administered 2014-11-15: 6 mg via INTRAMUSCULAR

## 2014-11-15 MED ORDER — METOPROLOL SUCCINATE ER 50 MG PO TB24: 50.0000 mg | ORAL_TABLET | Freq: Every day | ORAL | Status: DC

## 2014-11-15 MED ORDER — EPINEPHRINE 0.3 MG/0.3ML IJ SOAJ: 0.3000 mg | Freq: Once | INTRAMUSCULAR | Status: DC

## 2014-11-15 NOTE — Progress Notes (Signed)
Subjective:  Patient ID: Brittany Jenkins, female    DOB: [birth year]  Age: 49 y.o. MRN: [id number]  CC: Abdominal Pain and Allergic Reaction   HPI [name] presents for onset last night of swelling around the mouth but not of the gums or lips or tongue. She's had itching on the palms of the hands and on the torso under the breasts. An odd tingly sensation has occurred. There was no shortness of breath noted. No palpitations. The symptoms have subsided somewhat today. 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 She recently got a new puppy however the puppy stays outside. She realizes that dander from the puppy may have gotten on her from her hands from petting the dog. She did have a paroxysmal of cough lasting for several minutes last night. She continues to have some coughing she feels like there's something in her throat that will not come up. She also has had to have an EpiPen in the past for her allergies. She did not use it on this occasion. She has not had one in quite some time just because she hasn't needed it in a long time.  2-3 weeks of increasing epigastric and right upper quadrant pain. It has a sharp sensation radiating to her mid back. This is not localized to the right shoulder blade area however. It has been accompanied by nausea. She comments that her reflux pill, omeprazole, is no longer effective. It is episodic. Symptoms coming daily. It is increasing in frequency and severity. However, it remains moderate in intensity at peak. She is still able to eat normally.  Blood sugars are unchanged from baseline.  History Vallorie has a past medical history of Diabetes mellitus without complication; Asthma; GERD (gastroesophageal reflux disease); Hyperlipidemia; Hypertension; and Microalbuminuria.   She has past surgical history that includes Hernia repair.   Her family history includes Arthritis in her father and mother; Asthma in her mother; Diabetes in her  father and mother; GER disease in her father; Hyperlipidemia in her father and mother; Hypertension in her father and mother.She reports that she quit smoking about 16 years ago. Her smoking use included Cigarettes. She does not have any smokeless tobacco history on file. Her alcohol and drug histories are not on file.  Outpatient Prescriptions Prior to Visit  Medication Sig Dispense Refill  . aspirin EC 81 MG tablet Take 81 mg by mouth daily.    . cholecalciferol (VITAMIN D) 1000 UNITS tablet Take 1,000 Units by mouth daily.    . Fluticasone Furoate-Vilanterol (BREO ELLIPTA) 200-25 MCG/INH AEPB Inhale 1 puff into the lungs daily. 1 each 11  . glipiZIDE (GLUCOTROL) 5 MG tablet Take 1 tablet (5 mg total) by mouth 3 (three) times daily. 30 tablet 5  . ipratropium-albuterol (DUONEB) 0.5-2.5 (3) MG/3ML SOLN Take 3 mLs by nebulization every 6 (six) hours as needed. 360 mL 1  . lisinopril-hydrochlorothiazide (ZESTORETIC) 20-12.5 MG per tablet Take 1 tablet by mouth daily. 90 tablet 1  . Omega-3 Fatty Acids (FISH OIL) 1000 MG CAPS Take 2 capsules by mouth daily.    Marland Kitchen omeprazole (PRILOSEC OTC) 20 MG tablet Take 20 mg by mouth daily.    . ondansetron (ZOFRAN) 4 MG tablet Take 1 tablet (4 mg total) by mouth 4 (four) times daily as needed for nausea or vomiting. 30 tablet 2  . traMADol (ULTRAM) 50 MG tablet Take 1 tablet (50 mg total) by mouth every 6 (six) hours as needed. 30 tablet 0  .  VENTOLIN HFA 108 (90 BASE) MCG/ACT inhaler INHALE 2 PUFFS BY MOUTH EVERY 4 TO 6 HOURS AS A RESCUE FOR ASTHMA 18 g 2  . saxagliptin HCl (ONGLYZA) 5 MG TABS tablet Take 1 tablet (5 mg total) by mouth daily. (Patient not taking: Reported on 11/15/2014) 30 tablet 2   No facility-administered medications prior to visit.    ROS Review of Systems  Constitutional: Negative for fever, chills, diaphoresis, appetite change, fatigue and unexpected weight change.  HENT: Positive for facial swelling. Negative for congestion, ear pain,  hearing loss, postnasal drip, rhinorrhea, sneezing, sore throat and trouble swallowing.   Eyes: Negative for pain.  Respiratory: Positive for cough. Negative for chest tightness and shortness of breath.   Cardiovascular: Negative for chest pain and palpitations.  Gastrointestinal: Positive for nausea, abdominal pain and abdominal distention. Negative for vomiting, diarrhea and constipation.  Genitourinary: Negative for dysuria, frequency and menstrual problem.  Musculoskeletal: Negative for joint swelling and arthralgias.  Skin: Negative for rash.       See history of present illness  Neurological: Negative for weakness and headaches.  Hematological: Negative.   Psychiatric/Behavioral: Negative for dysphoric mood and agitation.    Objective:  BP 155/70 mmHg  Pulse 79  Temp(Src) 97.9 F (36.6 C) (Oral)  Ht 5' 7"  (1.702 m)  Wt 167 lb 6.4 oz (75.932 kg)  BMI 26.21 kg/m2  LMP 11/05/2014 (Approximate)  BP Readings from Last 3 Encounters:  11/15/14 155/70  10/22/14 123/68  08/17/14 137/71    Wt Readings from Last 3 Encounters:  11/15/14 167 lb 6.4 oz (75.932 kg)  10/22/14 170 lb 9.6 oz (77.384 kg)  08/17/14 172 lb 6.4 oz (78.2 kg)     Physical Exam  Lab Results  Component Value Date   HGBA1C 8.1 10/22/2014   HGBA1C 9.5 07/11/2014   HGBA1C 9.0 03/21/2014    Lab Results  Component Value Date   WBC 9.3 10/22/2014   HGB 11.4* 10/22/2014   HCT 37.2* 10/22/2014   GLUCOSE 116* 10/22/2014   CHOL 192 10/22/2014   TRIG 133 10/22/2014   HDL 43 10/22/2014   LDLCALC 122* 10/22/2014   ALT 13 10/22/2014   AST 14 10/22/2014   NA 144 10/22/2014   K 3.9 10/22/2014   CL 104 10/22/2014   CREATININE 0.65 10/22/2014   BUN 10 10/22/2014   CO2 23 10/22/2014   TSH 1.160 07/11/2014   HGBA1C 8.1 10/22/2014    US Abdomen Complete  09/09/2011   *RADIOLOGY REPORT*  Clinical Data:  Left lower abdominal pain with palpable mass in the subcutaneous soft tissues in the left lower  quadrant.  Previous bilateral inguinal hernia repair.  COMPLETE ABDOMINAL ULTRASOUND  Comparison:  None.  Findings: The patient has a 1.4 x 0.6 x 1.2 cm hypoechoic palpable fluid collection in the subcutaneous fat of the left lower quadrant.  This correlates with the patients reported abnormality. The patient reports that she had trauma to that area several months ago and had significant bruising in the area.  The size and configuration and echogenicity of this lesion is consistent with residual seroma from the subcutaneous hematoma.  The underlying muscle structures are intact.  This does not represent a hernia.  Gallbladder:  No gallstones, gallbladder wall thickening, or pericholecystic fluid. Negative sonographic Murphy's sign.  Common bile duct:  Normal.  4.8 cm in diameter.  Liver:  No focal lesion identified.  Within normal limits in parenchymal echogenicity.  IVC:  Appears normal.  Pancreas:  Normal.  Spleen:  Normal.  4.9 cm in length.  Right Kidney:  Normal.  10.9 cm in length.  Left Kidney:  11.4 cm in length.  9 mm simple appearing cyst on the lower pole.  Abdominal aorta:  Normal.  2.4 cm maximal diameter.  IMPRESSION:  1.  Small residual seroma in the subcutaneous fat of the left lower quadrant from previous hematoma. 2.  Otherwise, normal exam.  Original Report Authenticated By: Larey Seat, M.D.   Assessment & Plan:   Casondra was seen today for abdominal pain and allergic reaction.  Diagnoses and all orders for this visit:  Angioedema, initial encounter Orders: -     Ambulatory referral to Allergy -     POCT CBC -     betamethasone acetate-betamethasone sodium phosphate (CELESTONE) injection 6 mg; Inject 1 mL (6 mg total) into the muscle once.  Pain of upper abdomen Orders: -     CMP14+EGFR -     Amylase -     Lipase -     US Abdomen Complete; Future  Diabetes mellitus without complication Orders: -     CMP14+EGFR  Gastroesophageal reflux disease with  esophagitis Orders: -     CMP14+EGFR  Other orders -     EPINEPHrine 0.3 mg/0.3 mL IJ SOAJ injection; Inject 0.3 mLs (0.3 mg total) into the muscle once. -     metoprolol succinate (TOPROL-XL) 50 MG 24 hr tablet; Take 1 tablet (50 mg total) by mouth daily. Take with or immediately following a meal.   I have discontinued Ms. Toothaker's saxagliptin HCl. I have also changed her EPINEPHrine. Additionally, I am having her start on metoprolol succinate. Lastly, I am having her maintain her omeprazole, Fish Oil, cholecalciferol, aspirin EC, ipratropium-albuterol, traMADol, ondansetron, lisinopril-hydrochlorothiazide, glipiZIDE, VENTOLIN HFA, Fluticasone Furoate-Vilanterol, and HYDROcodone-acetaminophen. We will continue to administer betamethasone acetate-betamethasone sodium phosphate.  Meds ordered this encounter  Medications  . HYDROcodone-acetaminophen (NORCO) 7.5-325 MG per tablet    Sig: Take 1 tablet by mouth.     Refill:  0  . DISCONTD: EPINEPHrine 0.3 mg/0.3 mL IJ SOAJ injection    Sig: Inject 0.3 mg into the muscle once.  Marland Kitchen EPINEPHrine 0.3 mg/0.3 mL IJ SOAJ injection    Sig: Inject 0.3 mLs (0.3 mg total) into the muscle once.    Dispense:  2 Device    Refill:  3  . betamethasone acetate-betamethasone sodium phosphate (CELESTONE) injection 6 mg    Sig:   . metoprolol succinate (TOPROL-XL) 50 MG 24 hr tablet    Sig: Take 1 tablet (50 mg total) by mouth daily. Take with or immediately following a meal.    Dispense:  90 tablet    Refill:  3     Follow-up: Return in about 6 weeks (around 12/27/2014), or if symptoms worsen or fail to improve.  Claretta Fraise, M.D.

## 2014-11-16 ENCOUNTER — Telehealth: Payer: Self-pay | Admitting: Family Medicine

## 2014-11-16 ENCOUNTER — Ambulatory Visit (HOSPITAL_COMMUNITY)
Admission: RE | Admit: 2014-11-16 | Discharge: 2014-11-16 | Disposition: A | Discharge status: Home / Self Care | Payer: BLUE CROSS/BLUE SHIELD | Source: Ambulatory Visit | Attending: Family Medicine | Admitting: Family Medicine

## 2014-11-16 ENCOUNTER — Other Ambulatory Visit (HOSPITAL_COMMUNITY): Payer: Self-pay

## 2014-11-16 DIAGNOSIS — R110 Nausea: Secondary | ICD-10-CM | POA: Insufficient documentation

## 2014-11-16 DIAGNOSIS — R109 Unspecified abdominal pain: Secondary | ICD-10-CM | POA: Diagnosis present

## 2014-11-16 DIAGNOSIS — R932 Abnormal findings on diagnostic imaging of liver and biliary tract: Secondary | ICD-10-CM | POA: Insufficient documentation

## 2014-11-16 DIAGNOSIS — R1010 Upper abdominal pain, unspecified: Secondary | ICD-10-CM

## 2014-11-16 NOTE — Telephone Encounter (Signed)
We have no coupons for discounts on Epi Pens.  Try to check on line with the Company name of manufacturer to see if a coupon is available.

## 2014-12-26 ENCOUNTER — Telehealth: Payer: Self-pay | Admitting: Family Medicine

## 2014-12-26 NOTE — Telephone Encounter (Signed)
Patient has started breaking out again and states that her lips swelled up and she is broke out in hives.

## 2015-02-12 ENCOUNTER — Other Ambulatory Visit: Payer: Self-pay | Admitting: Family Medicine

## 2015-02-25 ENCOUNTER — Institutional Professional Consult (permissible substitution): Payer: BLUE CROSS/BLUE SHIELD | Admitting: Internal Medicine

## 2015-03-08 ENCOUNTER — Other Ambulatory Visit: Payer: Self-pay | Admitting: Allergy and Immunology

## 2015-04-01 ENCOUNTER — Telehealth: Payer: Self-pay | Admitting: Family Medicine

## 2015-04-01 NOTE — Telephone Encounter (Signed)
Left detailed message stating she will need to come by the office to pick up rx and to CB with any further questions or concerns.

## 2015-04-01 NOTE — Telephone Encounter (Signed)
Please write the order and I will sign it. Thanks, WS.

## 2015-04-01 NOTE — Telephone Encounter (Signed)
I believe it has to be a handwritten rx and pt has to pick it up if you ok it.

## 2015-04-02 ENCOUNTER — Telehealth: Payer: Self-pay | Admitting: *Deleted

## 2015-04-02 DIAGNOSIS — J4540 Moderate persistent asthma, uncomplicated: Secondary | ICD-10-CM

## 2015-04-02 NOTE — Telephone Encounter (Signed)
Pt came in for RX for new nebulizer machine RX printed and given to pt

## 2015-04-24 ENCOUNTER — Other Ambulatory Visit: Payer: Self-pay | Admitting: Family Medicine

## 2015-04-24 ENCOUNTER — Encounter: Payer: Self-pay | Admitting: Family Medicine

## 2015-04-24 ENCOUNTER — Ambulatory Visit (INDEPENDENT_AMBULATORY_CARE_PROVIDER_SITE_OTHER): Payer: BLUE CROSS/BLUE SHIELD | Admitting: Family Medicine

## 2015-04-24 VITALS — BP 160/85 | HR 87 | Temp 97.9°F | Ht 67.0 in | Wt 165.2 lb

## 2015-04-24 DIAGNOSIS — E119 Type 2 diabetes mellitus without complications: Secondary | ICD-10-CM

## 2015-04-24 DIAGNOSIS — E785 Hyperlipidemia, unspecified: Secondary | ICD-10-CM

## 2015-04-24 DIAGNOSIS — I10 Essential (primary) hypertension: Secondary | ICD-10-CM

## 2015-04-24 LAB — POCT GLYCOSYLATED HEMOGLOBIN (HGB A1C): Hemoglobin A1C: 10

## 2015-04-24 MED ORDER — GLIPIZIDE 5 MG PO TABS: 5.0000 mg | ORAL_TABLET | Freq: Three times a day (TID) | ORAL | Status: DC

## 2015-04-24 MED ORDER — CANAGLIFLOZIN 300 MG PO TABS: 300.0000 mg | ORAL_TABLET | Freq: Every day | ORAL | Status: DC

## 2015-04-24 NOTE — Patient Instructions (Signed)
DASH Eating Plan  DASH stands for "Dietary Approaches to Stop Hypertension." The DASH eating plan is a healthy eating plan that has been shown to reduce high blood pressure (hypertension). Additional health benefits may include reducing the risk of type 2 diabetes mellitus, heart disease, and stroke. The DASH eating plan may also help with weight loss.  WHAT DO I NEED TO KNOW ABOUT THE DASH EATING PLAN?  For the DASH eating plan, you will follow these general guidelines:  · Choose foods with a percent daily value for sodium of less than 5% (as listed on the food label).  · Use salt-free seasonings or herbs instead of table salt or sea salt.  · Check with your health care provider or pharmacist before using salt substitutes.  · Eat lower-sodium products, often labeled as "lower sodium" or "no salt added."  · Eat fresh foods.  · Eat more vegetables, fruits, and low-fat dairy products.  · Choose whole grains. Look for the word "whole" as the first word in the ingredient list.  · Choose fish and skinless chicken or turkey more often than red meat. Limit fish, poultry, and meat to 6 oz (170 g) each day.  · Limit sweets, desserts, sugars, and sugary drinks.  · Choose heart-healthy fats.  · Limit cheese to 1 oz (28 g) per day.  · Eat more home-cooked food and less restaurant, buffet, and fast food.  · Limit fried foods.  · Cook foods using methods other than frying.  · Limit canned vegetables. If you do use them, rinse them well to decrease the sodium.  · When eating at a restaurant, ask that your food be prepared with less salt, or no salt if possible.  WHAT FOODS CAN I EAT?  Seek help from a dietitian for individual calorie needs.  Grains  Whole grain or whole wheat bread. Brown rice. Whole grain or whole wheat pasta. Quinoa, bulgur, and whole grain cereals. Low-sodium cereals. Corn or whole wheat flour tortillas. Whole grain cornbread. Whole grain crackers. Low-sodium crackers.  Vegetables  Fresh or frozen vegetables  (raw, steamed, roasted, or grilled). Low-sodium or reduced-sodium tomato and vegetable juices. Low-sodium or reduced-sodium tomato sauce and paste. Low-sodium or reduced-sodium canned vegetables.   Fruits  All fresh, canned (in natural juice), or frozen fruits.  Meat and Other Protein Products  Ground beef (85% or leaner), grass-fed beef, or beef trimmed of fat. Skinless chicken or turkey. Ground chicken or turkey. Pork trimmed of fat. All fish and seafood. Eggs. Dried beans, peas, or lentils. Unsalted nuts and seeds. Unsalted canned beans.  Dairy  Low-fat dairy products, such as skim or 1% milk, 2% or reduced-fat cheeses, low-fat ricotta or cottage cheese, or plain low-fat yogurt. Low-sodium or reduced-sodium cheeses.  Fats and Oils  Tub margarines without trans fats. Light or reduced-fat mayonnaise and salad dressings (reduced sodium). Avocado. Safflower, olive, or canola oils. Natural peanut or almond butter.  Other  Unsalted popcorn and pretzels.  The items listed above may not be a complete list of recommended foods or beverages. Contact your dietitian for more options.  WHAT FOODS ARE NOT RECOMMENDED?  Grains  White bread. White pasta. White rice. Refined cornbread. Bagels and croissants. Crackers that contain trans fat.  Vegetables  Creamed or fried vegetables. Vegetables in a cheese sauce. Regular canned vegetables. Regular canned tomato sauce and paste. Regular tomato and vegetable juices.  Fruits  Dried fruits. Canned fruit in light or heavy syrup. Fruit juice.  Meat and Other Protein   Products  Fatty cuts of meat. Ribs, chicken wings, bacon, sausage, bologna, salami, chitterlings, fatback, hot dogs, bratwurst, and packaged luncheon meats. Salted nuts and seeds. Canned beans with salt.  Dairy  Whole or 2% milk, cream, half-and-half, and cream cheese. Whole-fat or sweetened yogurt. Full-fat cheeses or blue cheese. Nondairy creamers and whipped toppings. Processed cheese, cheese spreads, or cheese  curds.  Condiments  Onion and garlic salt, seasoned salt, table salt, and sea salt. Canned and packaged gravies. Worcestershire sauce. Tartar sauce. Barbecue sauce. Teriyaki sauce. Soy sauce, including reduced sodium. Steak sauce. Fish sauce. Oyster sauce. Cocktail sauce. Horseradish. Ketchup and mustard. Meat flavorings and tenderizers. Bouillon cubes. Hot sauce. Tabasco sauce. Marinades. Taco seasonings. Relishes.  Fats and Oils  Butter, stick margarine, lard, shortening, ghee, and bacon fat. Coconut, palm kernel, or palm oils. Regular salad dressings.  Other  Pickles and olives. Salted popcorn and pretzels.  The items listed above may not be a complete list of foods and beverages to avoid. Contact your dietitian for more information.  WHERE CAN I FIND MORE INFORMATION?  National Heart, Lung, and [employer] Institute: www.nhlbi.nih.gov/health/health-topics/topics/dash/     This information is not intended to replace advice given to you by your health care provider. Make sure you discuss any questions you have with your health care provider.     Document Released: 04/23/2011 Document Revised: 05/25/2014 Document Reviewed: 03/08/2013  Elsevier Interactive Patient Education ©2016 [employer] Inc[employer]

## 2015-04-24 NOTE — Progress Notes (Signed)
Subjective:  Patient ID: Brittany Jenkins, female    DOB: December 26, 1965  Age: 49 y.o. MRN: [id number]  CC: Diabetes and Hypertension   HPI Halston Fairclough presents for  follow-up of hypertension. Patient has no history of headache chest pain or shortness of breath or recent cough. Patient also denies symptoms of TIA such as numbness weakness lateralizing. Patient checks  blood pressure at home and has not had any elevated readings recently. Patient denies side effects from his medication. States taking it regularly.  Patient also  in for follow-up of elevated cholesterol. Doing well without complaints. Currently no chest pain, shortness of breath or other cardiovascular related symptoms noted. She is not taking cholesterol medication currently. She is nonfasting today.  Follow-up of diabetes. Patient does check blood sugar at home. Readings run between125 and 150 fasting. Took prednisone, had it go as high as 370 last month. Patient denies symptoms such as polyuria, polydipsia, excessive hunger, nausea No significant hypoglycemic spells noted. Medications as noted below. Taking them regularly without complication/adverse reaction being reported today.    History Breyah has a past medical history of Diabetes mellitus without complication ([place]); Asthma; GERD (gastroesophageal reflux disease); Hyperlipidemia; Hypertension; and Microalbuminuria.   She has past surgical history that includes Hernia repair.   Her family history includes Arthritis in her father and mother; Asthma in her mother; Diabetes in her father and mother; GER disease in her father; Hyperlipidemia in her father and mother; Hypertension in her father and mother.She reports that she quit smoking about 16 years ago. Her smoking use included Cigarettes. She does not have any smokeless tobacco history on file. Her alcohol and drug histories are not on file.  Current Outpatient Prescriptions on File Prior to Visit  Medication Sig  Dispense Refill  . aspirin EC 81 MG tablet Take 81 mg by mouth daily.    . cholecalciferol (VITAMIN D) 1000 UNITS tablet Take 1,000 Units by mouth daily.    Marland Kitchen EPINEPHrine 0.3 mg/0.3 mL IJ SOAJ injection Inject 0.3 mLs (0.3 mg total) into the muscle once. 2 Device 3  . lisinopril-hydrochlorothiazide (ZESTORETIC) 20-12.5 MG per tablet Take 1 tablet by mouth daily. 90 tablet 1  . Omega-3 Fatty Acids (FISH OIL) 1000 MG CAPS Take 2 capsules by mouth daily.    . VENTOLIN HFA 108 (90 BASE) MCG/ACT inhaler INHALE 2 PUFFS EVERY 4-6 HOURS AS NEEDED FOR COUGH OR  WHEEZE 18 g 0   No current facility-administered medications on file prior to visit.    ROS Review of Systems  Constitutional: Negative for fever, activity change and appetite change.  HENT: Negative for congestion, rhinorrhea and sore throat.   Eyes: Negative for visual disturbance.  Respiratory: Negative for cough and shortness of breath.   Cardiovascular: Negative for chest pain and palpitations.  Gastrointestinal: Negative for nausea, abdominal pain and diarrhea.  Genitourinary: Negative for dysuria.  Musculoskeletal: Negative for myalgias and arthralgias.    Objective:  BP 160/85 mmHg  Pulse 87  Temp(Src) 97.9 F (36.6 C) (Oral)  Ht 5' 7"  (1.702 m)  Wt 165 lb 3.2 oz (74.934 kg)  BMI 25.87 kg/m2  SpO2 94%  LMP 12/23/2014  BP Readings from Last 3 Encounters:  04/24/15 160/85  11/15/14 155/70  10/22/14 123/68    Wt Readings from Last 3 Encounters:  04/24/15 165 lb 3.2 oz (74.934 kg)  11/15/14 167 lb 6.4 oz (75.932 kg)  10/22/14 170 lb 9.6 oz (77.384 kg)     Physical Exam  Constitutional: She  is oriented to person, place, and time. She appears well-developed and well-nourished. No distress.  HENT:  Head: Normocephalic and atraumatic.  Right Ear: External ear normal.  Left Ear: External ear normal.  Nose: Nose normal.  Mouth/Throat: Oropharynx is clear and moist.  Eyes: Conjunctivae and EOM are normal. Pupils are  equal, round, and reactive to light.  Neck: Normal range of motion. Neck supple. No thyromegaly present.  Cardiovascular: Normal rate, regular rhythm and normal heart sounds.   No murmur heard. Pulmonary/Chest: Effort normal and breath sounds normal. No respiratory distress. She has no wheezes. She has no rales.  Abdominal: Soft. Bowel sounds are normal. She exhibits no distension. There is no tenderness.  Lymphadenopathy:    She has no cervical adenopathy.  Neurological: She is alert and oriented to person, place, and time. She has normal reflexes.  Skin: Skin is warm and dry.  Psychiatric: She has a normal mood and affect. Her behavior is normal. Judgment and thought content normal.    Lab Results  Component Value Date   HGBA1C 10.0 04/24/2015   HGBA1C 8.1 10/22/2014   HGBA1C 9.5 07/11/2014    Lab Results  Component Value Date   WBC 9.3 10/22/2014   HGB 11.4* 10/22/2014   HCT 37.2* 10/22/2014   GLUCOSE 116* 10/22/2014   CHOL 192 10/22/2014   TRIG 133 10/22/2014   HDL 43 10/22/2014   LDLCALC 122* 10/22/2014   ALT 13 10/22/2014   AST 14 10/22/2014   NA 144 10/22/2014   K 3.9 10/22/2014   CL 104 10/22/2014   CREATININE 0.65 10/22/2014   BUN 10 10/22/2014   CO2 23 10/22/2014   TSH 1.160 07/11/2014   HGBA1C 10.0 04/24/2015    US Abdomen Complete  11/16/2014  CLINICAL DATA:  Abdominal pain with nausea EXAM: ULTRASOUND ABDOMEN COMPLETE COMPARISON:  None. FINDINGS: Gallbladder: No gallstones or wall thickening visualized. There is no pericholecystic fluid. No sonographic Murphy sign noted. Common bile duct: Diameter: 4 mm. There is no intrahepatic, common hepatic, or common bile duct dilatation. Liver: No focal lesion identified. Liver echogenicity overall is increased. Liver is prominent measuring 20.7 cm in length. IVC: No abnormality visualized. Pancreas: No mass or inflammatory focus. Spleen: Size and appearance within normal limits. Right Kidney: Length: 12.1 cm. Echogenicity  within normal limits. No mass or hydronephrosis visualized. Left Kidney: Length: 12.2 cm. Echogenicity within normal limits. No mass or hydronephrosis visualized. Abdominal aorta: No aneurysm visualized. Other findings: No demonstrable ascites. IMPRESSION: Prominent liver with increased liver echogenicity, a finding most likely due to hepatic steatosis. While no focal liver lesions are identified, it must be cautioned that the sensitivity of ultrasound for focal liver lesions is diminished in this circumstance. Study otherwise unremarkable. Electronically Signed   By: Lowella Grip III M.D.   On: 11/16/2014 11:31    Assessment & Plan:   Jamilya was seen today for diabetes and hypertension.  Diagnoses and all orders for this visit:  Diabetes mellitus without complication ([place]) -     POCT glycosylated hemoglobin (Hb A1C) -     CMP14+EGFR  Hyperlipidemia -     Cancel: Lipid panel  Essential hypertension -     CBC with Differential/Platelet  Other orders -     Discontinue: glipiZIDE (GLUCOTROL) 5 MG tablet; Take 1 tablet (5 mg total) by mouth 3 (three) times daily. -     canagliflozin (INVOKANA) 300 MG TABS tablet; Take 300 mg by mouth daily before breakfast.   I have discontinued  Ms. Lacey's ipratropium-albuterol, traMADol, ondansetron, Fluticasone Furoate-Vilanterol, HYDROcodone-acetaminophen, metoprolol succinate, glipiZIDE, and glipiZIDE. I am also having her start on canagliflozin. Additionally, I am having her maintain her Fish Oil, cholecalciferol, aspirin EC, lisinopril-hydrochlorothiazide, EPINEPHrine, VENTOLIN HFA, sitaGLIPtin, loratadine, diphenhydrAMINE, omeprazole, Fluticasone-Salmeterol, famotidine, and ferrous fumarate.  Meds ordered this encounter  Medications  . sitaGLIPtin (JANUVIA) 100 MG tablet    Sig: Take 100 mg by mouth daily.  Marland Kitchen loratadine (CLARITIN) 10 MG tablet    Sig: Take 10 mg by mouth every other day.  . diphenhydrAMINE (BENADRYL) 25 mg capsule    Sig:  Take 25 mg by mouth every other day.  Marland Kitchen omeprazole (PRILOSEC) 20 MG capsule    Sig: Take 20 mg by mouth 2 (two) times daily before a meal.  . Fluticasone-Salmeterol (ADVAIR) 250-50 MCG/DOSE AEPB    Sig: Inhale 1 puff into the lungs 2 (two) times daily.  . famotidine (PEPCID) 10 MG tablet    Sig: Take 10 mg by mouth 2 (two) times daily.  . ferrous fumarate (HEMOCYTE - 106 MG FE) 325 (106 FE) MG TABS tablet    Sig: Take 1 tablet by mouth daily.  Marland Kitchen DISCONTD: glipiZIDE (GLUCOTROL) 5 MG tablet    Sig: Take 1 tablet (5 mg total) by mouth 3 (three) times daily.    Dispense:  90 tablet    Refill:  2  . canagliflozin (INVOKANA) 300 MG TABS tablet    Sig: Take 300 mg by mouth daily before breakfast.    Dispense:  30 tablet    Refill:  2     Follow-up: Return in about 1 month (around 05/25/2015) for cholesterol, hypertension.  Claretta Fraise, M.D.

## 2015-04-25 LAB — CBC WITH DIFFERENTIAL/PLATELET
BASOS ABS: 0.1 10*3/uL (ref 0.0–0.2)
BASOS: 1 %
EOS (ABSOLUTE): 0.4 10*3/uL (ref 0.0–0.4)
EOS: 4 %
HEMATOCRIT: 46.1 % (ref 34.0–46.6)
Hemoglobin: 15.4 g/dL (ref 11.1–15.9)
IMMATURE GRANS (ABS): 0.1 10*3/uL (ref 0.0–0.1)
Immature Granulocytes: 1 %
LYMPHS ABS: 2.2 10*3/uL (ref 0.7–3.1)
LYMPHS: 25 %
MCH: 30.1 pg (ref 26.6–33.0)
MCHC: 33.4 g/dL (ref 31.5–35.7)
MCV: 90 fL (ref 79–97)
MONOCYTES: 7 %
Monocytes Absolute: 0.6 10*3/uL (ref 0.1–0.9)
Neutrophils Absolute: 5.4 10*3/uL (ref 1.4–7.0)
Neutrophils: 62 %
Platelets: 368 10*3/uL (ref 150–379)
RBC: 5.12 x10E6/uL (ref 3.77–5.28)
RDW: 14.1 % (ref 12.3–15.4)
WBC: 8.7 10*3/uL (ref 3.4–10.8)

## 2015-04-25 LAB — CMP14+EGFR
ALBUMIN: 4.7 g/dL (ref 3.5–5.5)
ALK PHOS: 76 IU/L (ref 39–117)
ALT: 35 IU/L — ABNORMAL HIGH (ref 0–32)
AST: 22 IU/L (ref 0–40)
Albumin/Globulin Ratio: 1.7 (ref 1.1–2.5)
BILIRUBIN TOTAL: 0.3 mg/dL (ref 0.0–1.2)
BUN / CREAT RATIO: 11 (ref 9–23)
BUN: 7 mg/dL (ref 6–24)
CHLORIDE: 99 mmol/L (ref 97–106)
CO2: 25 mmol/L (ref 18–29)
Calcium: 10 mg/dL (ref 8.7–10.2)
Creatinine, Ser: 0.62 mg/dL (ref 0.57–1.00)
GFR calc Af Amer: 121 mL/min/{1.73_m2} (ref >59)
GFR calc non Af Amer: 105 mL/min/{1.73_m2} (ref >59)
GLOBULIN, TOTAL: 2.7 g/dL (ref 1.5–4.5)
GLUCOSE: 200 mg/dL — AB (ref 65–99)
Potassium: 3.9 mmol/L (ref 3.5–5.2)
Sodium: 140 mmol/L (ref 136–144)
TOTAL PROTEIN: 7.4 g/dL (ref 6.0–8.5)

## 2015-04-25 NOTE — Telephone Encounter (Signed)
She can discontinue the iron.

## 2015-04-26 ENCOUNTER — Telehealth: Payer: Self-pay | Admitting: Family Medicine

## 2015-04-26 MED ORDER — AMOXICILLIN 875 MG PO TABS
875.0000 mg | ORAL_TABLET | Freq: Two times a day (BID) | ORAL | Status: DC
Start: 2015-04-26 — End: 2015-05-31

## 2015-04-26 NOTE — Telephone Encounter (Signed)
Amoxicillin prescription sent. 

## 2015-04-26 NOTE — Telephone Encounter (Signed)
Left detailed message that rx was [facility] to pharmacy as [facility] or [facility].

## 2015-04-26 NOTE — Telephone Encounter (Signed)
Please advise on rx and route to [facility] 

## 2015-05-20 ENCOUNTER — Other Ambulatory Visit: Payer: Self-pay | Admitting: Family Medicine

## 2015-05-31 ENCOUNTER — Encounter: Payer: Self-pay | Admitting: Family Medicine

## 2015-05-31 ENCOUNTER — Ambulatory Visit (INDEPENDENT_AMBULATORY_CARE_PROVIDER_SITE_OTHER): Payer: BLUE CROSS/BLUE SHIELD | Admitting: Family Medicine

## 2015-05-31 VITALS — BP 134/76 | HR 84 | Temp 97.9°F | Ht 67.0 in | Wt 165.0 lb

## 2015-05-31 DIAGNOSIS — I10 Essential (primary) hypertension: Secondary | ICD-10-CM

## 2015-05-31 DIAGNOSIS — E785 Hyperlipidemia, unspecified: Secondary | ICD-10-CM

## 2015-05-31 DIAGNOSIS — E119 Type 2 diabetes mellitus without complications: Secondary | ICD-10-CM | POA: Diagnosis not present

## 2015-05-31 NOTE — Progress Notes (Signed)
Subjective:  Patient ID: Brittany Jenkins, female    DOB: 11-06-1965  Age: 50 y.o. MRN: [id number]  CC: Hyperlipidemia and Hypertension   HPI Brittany PentonKimberly Jenkins presents for  follow-up of hypertension. Patient has no history of headache chest pain or shortness of breath or recent cough. Patient also denies symptoms of TIA such as numbness weakness lateralizing. Patient checks  blood pressure at home and has not had any elevated readings recently. Patient denies side effects from his medication. States taking it regularly.  Patient also  in for follow-up of elevated cholesterol. Doing well without complaints on current medication. Denies side effects of statin including myalgia and arthralgia and nausea. Also in today for liver function testing. Currently no chest pain, shortness of breath or other cardiovascular related symptoms noted.   Blood glucose dropped from 200s to around 150 with Januvia and glipizide   History Brittany Jenkins has a past medical history of Diabetes mellitus without complication ([facility]); Asthma; GERD (gastroesophageal reflux disease); Hyperlipidemia; Hypertension; and Microalbuminuria.   She has past surgical history that includes Hernia repair.   Her family history includes Arthritis in her father and mother; Asthma in her mother; Diabetes in her father and mother; GER disease in her father; Hyperlipidemia in her father and mother; Hypertension in her father and mother.She reports that she quit smoking about 17 years ago. Her smoking use included Cigarettes. She does not have any smokeless tobacco history on file. Her alcohol and drug histories are not on file.  Current Outpatient Prescriptions on File Prior to Visit  Medication Sig Dispense Refill  . aspirin EC 81 MG tablet Take 81 mg by mouth daily.    . cholecalciferol (VITAMIN D) 1000 UNITS tablet Take 1,000 Units by mouth daily.    . diphenhydrAMINE (BENADRYL) 25 mg capsule Take 25 mg by mouth every other day.    Marland Kitchen.  EPINEPHrine 0.3 mg/0.3 mL IJ SOAJ injection Inject 0.3 mLs (0.3 mg total) into the muscle once. 2 Device 3  . famotidine (PEPCID) 10 MG tablet Take 10 mg by mouth 2 (two) times daily.    Marland Kitchen. JANUVIA 100 MG tablet TAKE ONE TABLET BY MOUTH ONE TIME DAILY 30 tablet 1  . lisinopril-hydrochlorothiazide (ZESTORETIC) 20-12.5 MG per tablet Take 1 tablet by mouth daily. 90 tablet 1  . loratadine (CLARITIN) 10 MG tablet Take 10 mg by mouth every other day.    . Omega-3 Fatty Acids (FISH OIL) 1000 MG CAPS Take 2 capsules by mouth daily.    Marland Kitchen. omeprazole (PRILOSEC) 20 MG capsule Take 20 mg by mouth 2 (two) times daily before a meal.    . VENTOLIN HFA 108 (90 BASE) MCG/ACT inhaler INHALE 2 PUFFS BY MOUTH EVERY 4 TO 6 HOURS AS A RESCUE FOR ASTHMA 18 g 5   No current facility-administered medications on file prior to visit.    ROS Review of Systems  Constitutional: Negative for fever, activity change and appetite change.  HENT: Negative for congestion, rhinorrhea and sore throat.   Eyes: Negative for visual disturbance.  Respiratory: Negative for cough and shortness of breath.   Cardiovascular: Negative for chest pain and palpitations.  Gastrointestinal: Negative for nausea, abdominal pain and diarrhea.  Genitourinary: Negative for dysuria.  Musculoskeletal: Negative for myalgias and arthralgias.    Objective:  BP 134/76 mmHg  Pulse 84  Temp(Src) 97.9 F (36.6 C) (Oral)  Ht 5\' 7"  (1.702 m)  Wt 165 lb (74.844 kg)  BMI 25.84 kg/m2  SpO2 97%  LMP 12/17/2014 (Approximate)  BP Readings from Last 3 Encounters:  05/31/15 134/76  04/24/15 160/85  11/15/14 155/70    Wt Readings from Last 3 Encounters:  05/31/15 165 lb (74.844 kg)  04/24/15 165 lb 3.2 oz (74.934 kg)  11/15/14 167 lb 6.4 oz (75.932 kg)     Physical Exam  Constitutional: She is oriented to person, place, and time. She appears well-developed and well-nourished. No distress.  HENT:  Head: Normocephalic and atraumatic.  Right Ear:  External ear normal.  Left Ear: External ear normal.  Nose: Nose normal.  Mouth/Throat: Oropharynx is clear and moist.  Eyes: Conjunctivae and EOM are normal. Pupils are equal, round, and reactive to light.  Neck: Normal range of motion. Neck supple. No thyromegaly present.  Cardiovascular: Normal rate, regular rhythm and normal heart sounds.   No murmur heard. Pulmonary/Chest: Effort normal and breath sounds normal. No respiratory distress. She has no wheezes. She has no rales.  Abdominal: Soft. Bowel sounds are normal. She exhibits no distension. There is no tenderness.  Lymphadenopathy:    She has no cervical adenopathy.  Neurological: She is alert and oriented to person, place, and time. She has normal reflexes.  Skin: Skin is warm and dry.  Psychiatric: She has a normal mood and affect. Her behavior is normal. Judgment and thought content normal.    Lab Results  Component Value Date   HGBA1C 10.0 04/24/2015   HGBA1C 8.1 10/22/2014   HGBA1C 9.5 07/11/2014    Lab Results  Component Value Date   WBC 8.7 04/24/2015   HGB 11.4* 10/22/2014   HCT 46.1 04/24/2015   PLT 368 04/24/2015   GLUCOSE 200* 04/24/2015   CHOL 192 10/22/2014   TRIG 133 10/22/2014   HDL 43 10/22/2014   LDLCALC 122* 10/22/2014   ALT 35* 04/24/2015   AST 22 04/24/2015   NA 140 04/24/2015   K 3.9 04/24/2015   CL 99 04/24/2015   CREATININE 0.62 04/24/2015   BUN 7 04/24/2015   CO2 25 04/24/2015   TSH 1.160 07/11/2014   HGBA1C 10.0 04/24/2015    US Abdomen Complete  11/16/2014  CLINICAL DATA:  Abdominal pain with nausea EXAM: ULTRASOUND ABDOMEN COMPLETE COMPARISON:  None. FINDINGS: Gallbladder: No gallstones or wall thickening visualized. There is no pericholecystic fluid. No sonographic Murphy sign noted. Common bile duct: Diameter: 4 mm. There is no intrahepatic, common hepatic, or common bile duct dilatation. Liver: No focal lesion identified. Liver echogenicity overall is increased. Liver is prominent  measuring 20.7 cm in length. IVC: No abnormality visualized. Pancreas: No mass or inflammatory focus. Spleen: Size and appearance within normal limits. Right Kidney: Length: 12.1 cm. Echogenicity within normal limits. No mass or hydronephrosis visualized. Left Kidney: Length: 12.2 cm. Echogenicity within normal limits. No mass or hydronephrosis visualized. Abdominal aorta: No aneurysm visualized. Other findings: No demonstrable ascites. IMPRESSION: Prominent liver with increased liver echogenicity, a finding most likely due to hepatic steatosis. While no focal liver lesions are identified, it must be cautioned that the sensitivity of ultrasound for focal liver lesions is diminished in this circumstance. Study otherwise unremarkable. Electronically Signed   By: Bretta Bang III M.D.   On: 11/16/2014 11:31    Assessment & Plan:   Brittany Jenkins was seen today for hyperlipidemia and hypertension.  Diagnoses and all orders for this visit:  Diabetes mellitus without complication ([facility])  Hyperlipidemia  Essential hypertension  I have discontinued Brittany Jenkins Fluticasone-Salmeterol, ferrous fumarate, canagliflozin, and amoxicillin. I am also having her maintain her Fish Oil, cholecalciferol,  aspirin EC, lisinopril-hydrochlorothiazide, EPINEPHrine, loratadine, diphenhydrAMINE, omeprazole, famotidine, VENTOLIN HFA, JANUVIA, BREO ELLIPTA, and glipiZIDE.  Meds ordered this encounter  Medications  . BREO ELLIPTA 200-25 MCG/INH AEPB    Sig:     Refill:  10  . glipiZIDE (GLUCOTROL) 5 MG tablet    Sig:     Refill:  1   Improved diabetes and hypertension. Statin phobic patient.  Follow-up: Return in about 3 months (around 08/29/2015) for diabetes.  Mechele Claude, M.D.

## 2015-06-07 LAB — HM DIABETES EYE EXAM

## 2015-06-11 ENCOUNTER — Encounter: Payer: Self-pay | Admitting: *Deleted

## 2015-06-17 ENCOUNTER — Other Ambulatory Visit: Payer: Self-pay | Admitting: Family Medicine

## 2015-06-18 NOTE — Telephone Encounter (Signed)
Last seen 05/31/15  Dr Darlyn Read  If approved print

## 2015-07-19 ENCOUNTER — Other Ambulatory Visit: Payer: Self-pay | Admitting: Family Medicine

## 2015-07-22 ENCOUNTER — Other Ambulatory Visit: Payer: Self-pay | Admitting: Family Medicine

## 2015-07-22 MED ORDER — SITAGLIPTIN PHOSPHATE 100 MG PO TABS: 100.0000 mg | ORAL_TABLET | Freq: Every day | ORAL | Status: DC

## 2015-07-22 NOTE — Telephone Encounter (Signed)
done

## 2015-08-17 ENCOUNTER — Other Ambulatory Visit: Payer: Self-pay | Admitting: Family Medicine

## 2015-08-23 ENCOUNTER — Telehealth: Payer: Self-pay | Admitting: Family Medicine

## 2015-08-23 ENCOUNTER — Ambulatory Visit: Payer: BLUE CROSS/BLUE SHIELD

## 2015-08-23 NOTE — Telephone Encounter (Signed)
Patient called stating that she has a headache, sinus pressure and swollen glands. Patient states she would like an antibiotic sent to [facility] in [place].  Patient states that she does not get off of work until late.  Informed patient of mychart visit also

## 2015-08-23 NOTE — Telephone Encounter (Signed)
In order to consider this patient request, the patient will need to see a provider, offer Saturday clinic

## 2015-08-23 NOTE — Telephone Encounter (Signed)
Patient aware.

## 2015-08-27 ENCOUNTER — Telehealth: Payer: Self-pay | Admitting: Family Medicine

## 2015-09-04 DIAGNOSIS — L298 Other pruritus: Secondary | ICD-10-CM | POA: Diagnosis not present

## 2015-09-04 DIAGNOSIS — B373 Candidiasis of vulva and vagina: Secondary | ICD-10-CM | POA: Diagnosis not present

## 2015-09-04 DIAGNOSIS — N952 Postmenopausal atrophic vaginitis: Secondary | ICD-10-CM | POA: Diagnosis not present

## 2015-09-04 DIAGNOSIS — N898 Other specified noninflammatory disorders of vagina: Secondary | ICD-10-CM | POA: Diagnosis not present

## 2015-09-10 ENCOUNTER — Other Ambulatory Visit: Payer: Self-pay | Admitting: Family Medicine

## 2015-09-23 DIAGNOSIS — N952 Postmenopausal atrophic vaginitis: Secondary | ICD-10-CM | POA: Diagnosis not present

## 2015-09-23 DIAGNOSIS — Z09 Encounter for follow-up examination after completed treatment for conditions other than malignant neoplasm: Secondary | ICD-10-CM | POA: Diagnosis not present

## 2015-09-23 DIAGNOSIS — N941 Unspecified dyspareunia: Secondary | ICD-10-CM | POA: Diagnosis not present

## 2015-10-22 ENCOUNTER — Other Ambulatory Visit: Payer: Self-pay | Admitting: Family Medicine

## 2015-10-22 NOTE — Telephone Encounter (Signed)
Last seen 05/31/15  Dr Stacks 

## 2015-10-23 NOTE — Telephone Encounter (Signed)
Last A1C was 04/2015, it was 10.0

## 2015-10-28 ENCOUNTER — Other Ambulatory Visit: Payer: Self-pay | Admitting: Family Medicine

## 2015-12-02 ENCOUNTER — Other Ambulatory Visit: Payer: Self-pay | Admitting: Family Medicine

## 2015-12-03 NOTE — Telephone Encounter (Signed)
Authorize 30 days only. Then contact the patient letting them know that they will need an appointment before any further prescriptions can be sent in. 

## 2015-12-16 DIAGNOSIS — Z01419 Encounter for gynecological examination (general) (routine) without abnormal findings: Secondary | ICD-10-CM | POA: Diagnosis not present

## 2015-12-16 DIAGNOSIS — Z09 Encounter for follow-up examination after completed treatment for conditions other than malignant neoplasm: Secondary | ICD-10-CM | POA: Diagnosis not present

## 2015-12-16 DIAGNOSIS — N952 Postmenopausal atrophic vaginitis: Secondary | ICD-10-CM | POA: Diagnosis not present

## 2015-12-16 DIAGNOSIS — Z6826 Body mass index (BMI) 26.0-26.9, adult: Secondary | ICD-10-CM | POA: Diagnosis not present

## 2015-12-30 ENCOUNTER — Other Ambulatory Visit: Payer: Self-pay | Admitting: Family Medicine

## 2015-12-31 ENCOUNTER — Other Ambulatory Visit: Payer: Self-pay | Admitting: Family Medicine

## 2016-01-07 ENCOUNTER — Other Ambulatory Visit: Payer: Self-pay | Admitting: Family Medicine

## 2016-01-15 ENCOUNTER — Other Ambulatory Visit: Payer: Self-pay | Admitting: Family Medicine

## 2016-01-29 ENCOUNTER — Other Ambulatory Visit: Payer: Self-pay | Admitting: Family Medicine

## 2016-01-29 NOTE — Telephone Encounter (Signed)
Authorize 30 days only. Then contact the patient letting them know that they will need an appointment before any further prescriptions can be sent in. 

## 2016-02-04 ENCOUNTER — Telehealth: Payer: Self-pay | Admitting: Family Medicine

## 2016-02-04 ENCOUNTER — Other Ambulatory Visit: Payer: Self-pay | Admitting: Family Medicine

## 2016-02-04 DIAGNOSIS — E119 Type 2 diabetes mellitus without complications: Secondary | ICD-10-CM

## 2016-02-04 DIAGNOSIS — E785 Hyperlipidemia, unspecified: Secondary | ICD-10-CM

## 2016-02-04 DIAGNOSIS — I10 Essential (primary) hypertension: Secondary | ICD-10-CM

## 2016-02-04 MED ORDER — FLUTICASONE FUROATE-VILANTEROL 200-25 MCG/INH IN AEPB: 1.0000 | INHALATION_SPRAY | Freq: Every day | RESPIRATORY_TRACT | 0 refills | Status: DC

## 2016-02-04 NOTE — Telephone Encounter (Signed)
Inhaler sent to pharmacy.  Needs lab orders

## 2016-02-04 NOTE — Telephone Encounter (Signed)
Standing orders placed. Please remind pt to come in fasting for blood work Materials engineerThanks, [employer]

## 2016-02-10 ENCOUNTER — Other Ambulatory Visit: Payer: Self-pay | Admitting: Family Medicine

## 2016-02-10 NOTE — Telephone Encounter (Signed)
Authorize 30 days only. Then contact the patient letting them know that they will need an appointment before any further prescriptions can be sent in. 

## 2016-02-26 ENCOUNTER — Other Ambulatory Visit: Payer: Self-pay | Admitting: Family Medicine

## 2016-02-28 ENCOUNTER — Telehealth: Payer: Self-pay | Admitting: Family Medicine

## 2016-02-28 MED ORDER — LISINOPRIL-HYDROCHLOROTHIAZIDE 20-12.5 MG PO TABS: 1.0000 | ORAL_TABLET | Freq: Every day | ORAL | 0 refills | Status: DC

## 2016-02-28 MED ORDER — FLUTICASONE FUROATE-VILANTEROL 200-25 MCG/INH IN AEPB: 1.0000 | INHALATION_SPRAY | Freq: Every day | RESPIRATORY_TRACT | 0 refills | Status: DC

## 2016-02-28 MED ORDER — GLIPIZIDE 5 MG PO TABS: 5.0000 mg | ORAL_TABLET | Freq: Three times a day (TID) | ORAL | 0 refills | Status: DC

## 2016-02-28 MED ORDER — SITAGLIPTIN PHOSPHATE 100 MG PO TABS
100.0000 mg | ORAL_TABLET | Freq: Every day | ORAL | 0 refills | Status: DC
Start: 2016-02-28 — End: 2016-03-14

## 2016-02-28 NOTE — Telephone Encounter (Signed)
Called pt to find out which meds. Done

## 2016-03-10 ENCOUNTER — Encounter: Payer: BLUE CROSS/BLUE SHIELD | Admitting: *Deleted

## 2016-03-10 ENCOUNTER — Ambulatory Visit: Payer: BLUE CROSS/BLUE SHIELD | Admitting: Family Medicine

## 2016-03-14 ENCOUNTER — Other Ambulatory Visit: Payer: Self-pay | Admitting: Family Medicine

## 2016-03-25 ENCOUNTER — Ambulatory Visit: Payer: BLUE CROSS/BLUE SHIELD | Admitting: Family Medicine

## 2016-03-26 ENCOUNTER — Ambulatory Visit (INDEPENDENT_AMBULATORY_CARE_PROVIDER_SITE_OTHER): Payer: BLUE CROSS/BLUE SHIELD | Admitting: Family Medicine

## 2016-03-26 ENCOUNTER — Encounter: Payer: Self-pay | Admitting: Family Medicine

## 2016-03-26 VITALS — BP 155/90 | HR 75 | Temp 97.5°F | Ht 67.0 in | Wt 166.8 lb

## 2016-03-26 DIAGNOSIS — J45901 Unspecified asthma with (acute) exacerbation: Secondary | ICD-10-CM | POA: Diagnosis not present

## 2016-03-26 DIAGNOSIS — B370 Candidal stomatitis: Secondary | ICD-10-CM

## 2016-03-26 DIAGNOSIS — R21 Rash and other nonspecific skin eruption: Secondary | ICD-10-CM | POA: Diagnosis not present

## 2016-03-26 MED ORDER — FLUCONAZOLE 150 MG PO TABS: ORAL_TABLET | ORAL | 0 refills | Status: DC

## 2016-03-26 MED ORDER — AMOXICILLIN-POT CLAVULANATE 875-125 MG PO TABS: 1.0000 | ORAL_TABLET | Freq: Two times a day (BID) | ORAL | 0 refills | Status: DC

## 2016-03-26 MED ORDER — NYSTATIN 100000 UNIT/ML MT SUSP: 5.0000 mL | Freq: Four times a day (QID) | OROMUCOSAL | 0 refills | Status: DC

## 2016-03-26 NOTE — Progress Notes (Signed)
   HPI  Patient presents today here with concern for thrush, worsening cough, and a rash.  Cough 2-3 weeks, persistent productive cough of thick white to yellow sputum. Mild increased worsening of breathing, however breathing is okay. No fevers, chills, sweats. No serious nasal congestion.  Thrush About one week patient has had white patches on the back of her throat, and inserted a bleed intermittently. Her dentist commented on it.  Rash Left posterior flank rash that's not really itchy, comes and goes last a few days at a time. She uses [place] ointment on it. It does not really bother her.  She occasionally has a rash under her breasts that's itchy a little bit more red and patchy.   PMH: Smoking status noted ROS: Per HPI  Objective: BP (!) 155/90   Pulse 75   Temp 97.5 F (36.4 C) (Oral)   Ht 5\' 7"  (1.702 m)   Wt 166 lb 12.8 oz (75.7 kg)   BMI 26.12 kg/m  Gen: NAD, alert, cooperative with exam HEENT: NCAT, nares with erythema and swelling, oropharynx with scattered erythema and patchy white plaques CV: RRR, good S1/S2, no murmur Resp: CTABL, no wheezes, non-labored Ext: No edema, warm Neuro: Alert and oriented, No gross deficits  Skin: 8 cm roughly circular erythematous flat rash on the left posterior flank  Assessment and plan:  # Asthma exacerbation Persistently worse cough for a few weeks. Patient states having versus COPD Given persistence we'll go ahead and cover with Augmentin Some question of COPD.  # Thrush Treat with nystatin swish and swallow, likely due to diabetes as well as using  inhaled corticosteroid  # Rash Posterior back rash with unclear etiology, possibly urticaria, however no good reason that she come back to the same area over never Under her breast is likely intertrigo, this is not present today and was not examined the Recommended over-the-counter Lotrimin as needed  Patient gets yeast infections with antibiotics, Diflucan  sent     Meds ordered this encounter  Medications  . metFORMIN (GLUCOPHAGE) 500 MG tablet    Sig: Take by mouth.  . nystatin (MYCOSTATIN) 100000 UNIT/ML suspension    Sig: Take 5 mLs (500,000 Units total) by mouth 4 (four) times daily. Swish and retain in mouth for several minutes prior to swallowing    Dispense:  100 mL    Refill:  0  . amoxicillin-clavulanate (AUGMENTIN) 875-125 MG tablet    Sig: Take 1 tablet by mouth 2 (two) times daily.    Dispense:  20 tablet    Refill:  0    Murtis SinkSam Bradshaw, MD [facility] 03/26/2016, 2:57 PM

## 2016-03-26 NOTE — Patient Instructions (Signed)
Great to meet you!  For the rash: Try hydrocortisone ( on the back), try lotrimin  For the one that comes under the breast  For cough- I have prescribed an antibiotic  For thrush- I have sent nystatin to your pharmacy

## 2016-03-29 ENCOUNTER — Other Ambulatory Visit: Payer: Self-pay | Admitting: Family Medicine

## 2016-04-02 ENCOUNTER — Other Ambulatory Visit: Payer: Self-pay | Admitting: Family Medicine

## 2016-04-06 ENCOUNTER — Ambulatory Visit: Payer: BLUE CROSS/BLUE SHIELD | Admitting: Family Medicine

## 2016-05-04 ENCOUNTER — Other Ambulatory Visit: Payer: Self-pay | Admitting: Family Medicine

## 2016-05-19 ENCOUNTER — Other Ambulatory Visit: Payer: Self-pay | Admitting: Family Medicine

## 2016-05-26 ENCOUNTER — Ambulatory Visit (INDEPENDENT_AMBULATORY_CARE_PROVIDER_SITE_OTHER): Payer: BLUE CROSS/BLUE SHIELD | Admitting: Family Medicine

## 2016-05-26 ENCOUNTER — Encounter: Payer: Self-pay | Admitting: Family Medicine

## 2016-05-26 VITALS — BP 143/81 | HR 72 | Temp 97.1°F | Ht 67.0 in | Wt 164.4 lb

## 2016-05-26 DIAGNOSIS — E119 Type 2 diabetes mellitus without complications: Secondary | ICD-10-CM

## 2016-05-26 DIAGNOSIS — M79672 Pain in left foot: Secondary | ICD-10-CM | POA: Diagnosis not present

## 2016-05-26 DIAGNOSIS — Z0000 Encounter for general adult medical examination without abnormal findings: Secondary | ICD-10-CM | POA: Insufficient documentation

## 2016-05-26 DIAGNOSIS — I10 Essential (primary) hypertension: Secondary | ICD-10-CM | POA: Diagnosis not present

## 2016-05-26 DIAGNOSIS — B370 Candidal stomatitis: Secondary | ICD-10-CM | POA: Diagnosis not present

## 2016-05-26 LAB — BAYER DCA HB A1C WAIVED: HB A1C (BAYER DCA - WAIVED): 9.1 % — ABNORMAL HIGH (ref <7.0)

## 2016-05-26 MED ORDER — NYSTATIN 100000 UNIT/ML MT SUSP: 5.0000 mL | Freq: Four times a day (QID) | OROMUCOSAL | 0 refills | Status: DC

## 2016-05-26 MED ORDER — SITAGLIP PHOS-METFORMIN HCL ER 100-1000 MG PO TB24: 1.0000 | ORAL_TABLET | Freq: Every day | ORAL | 0 refills | Status: DC

## 2016-05-26 MED ORDER — LISINOPRIL-HYDROCHLOROTHIAZIDE 20-12.5 MG PO TABS: 1.0000 | ORAL_TABLET | Freq: Every day | ORAL | 1 refills | Status: DC

## 2016-05-26 MED ORDER — ALBUTEROL SULFATE HFA 108 (90 BASE) MCG/ACT IN AERS: 2.0000 | INHALATION_SPRAY | Freq: Four times a day (QID) | RESPIRATORY_TRACT | 3 refills | Status: DC | PRN

## 2016-05-26 NOTE — Patient Instructions (Signed)
Great to see you!  Lets have you back to see me in 3 months.   Please stop [place]uvia and metformin, start janumet instead.   Come back to see one of our clinical pharmacists in 1 month for follow up of diabetes.

## 2016-05-26 NOTE — Progress Notes (Signed)
   HPI  Patient presents today here for follow-up of chronic medical conditions.  Type 2 diabetes Average fasting 150-200 Does not check postprandials Watching diet aggressively. Good medication compliance, however can only tolerate 1 dose of metformin daily due to upset stomach and nausea. She takes 500 mg once daily.  Victoza caused nausea and diarrhea.  Hypertension Good medication compliance, no chest pain.  Left foot pain Off and on for months, helped by supportive shoes. Worse with standing for long period of time or walking.  PMH: Smoking status noted ROS: Per HPI  Objective: BP (!) 143/81   Pulse 72   Temp 97.1 F (36.2 C) (Oral)   Ht _0  (1.702 m)   Wt 164 lb 6.4 oz (74.6 kg)   BMI 25.75 kg/m  Gen: NAD, alert, cooperative with exam HEENT: NCAT, oropharynx with white plaque on the soft palate CV: RRR, good S1/S2, no murmur Resp: CTABL, no wheezes, non-laboredy Ext: No edema, warm Neuro: Alert and oriented, No gross deficits  Diabetic Foot Exam - Simple   Simple Foot Form Visual Inspection No deformities, no ulcerations, no other skin breakdown bilaterally:  Yes Sensation Testing Intact to touch and monofilament testing bilaterally:  Yes Pulse Check Posterior Tibialis and Dorsalis pulse intact bilaterally:  Yes Comments Thick callus on left lateral forefoot      Assessment and plan:  # Type 2 diabetes Uncontrolled Change Januvia to Janumet, discussed insulin, ever she is not ready for this change. Continue glipizide, Janumet Follow-up with clinical pharmacist in one month, follow with me in 3 months Avoid G LP- 1  # Hypertension Reasonably well-controlled Continue Prinzide, labs ordered  # Left foot pain Unclear etiology, exam is reassuring Recommended supportive shoes, could consider orthotics if persistent. Discussed referral to sports medicine, she will consider  [facility] GI referral for C scope  Thrush Mild, nystatin swish and  swallow Likely due to breo + DM2  Orders Placed This Encounter  Procedures  . Bayer DCA Hb A1c Waived  . Microalbumin / creatinine urine ratio  . Lipid panel  . CMP14+EGFR  . CBC with Differential/Platelet  . Ambulatory referral to Gastroenterology    Referral Priority:   Routine    Referral Type:   Consultation    Referral Reason:   Specialty Services Required    Number of Visits Requested:   1    Meds ordered this encounter  Medications  . albuterol (VENTOLIN HFA) 108 (90 Base) MCG/ACT inhaler    Sig: Inhale 2 puffs into the lungs every 6 (six) hours as needed for wheezing or shortness of breath.    Dispense:  18 g    Refill:  3  . lisinopril-hydrochlorothiazide (PRINZIDE,ZESTORETIC) 20-12.5 MG tablet    Sig: Take 1 tablet by mouth daily.    Dispense:  90 tablet    Refill:  1  . SitaGLIPtin-MetFORMIN HCl (JANUMET XR) [phone] MG TB24    Sig: Take 1 tablet by mouth daily.    Dispense:  30 tablet    Refill:  0  . nystatin (MYCOSTATIN) 100000 UNIT/ML suspension    Sig: Take 5 mLs (500,000 Units total) by mouth 4 (four) times daily. Swish and retain in mouth for several minutes prior to swallowing    Dispense:  100 mL    Refill:  0    Laroy Apple, MD [facility] 05/26/2016, 12:18 PM

## 2016-05-27 LAB — CMP14+EGFR
ALK PHOS: 61 IU/L (ref 39–117)
ALT: 39 IU/L — AB (ref 0–32)
AST: 19 IU/L (ref 0–40)
Albumin/Globulin Ratio: 1.6 (ref 1.2–2.2)
Albumin: 4.4 g/dL (ref 3.5–5.5)
BILIRUBIN TOTAL: 0.4 mg/dL (ref 0.0–1.2)
BUN/Creatinine Ratio: 15 (ref 9–23)
BUN: 10 mg/dL (ref 6–24)
CHLORIDE: 97 mmol/L (ref 96–106)
CO2: 25 mmol/L (ref 18–29)
Calcium: 9.7 mg/dL (ref 8.7–10.2)
Creatinine, Ser: 0.68 mg/dL (ref 0.57–1.00)
GFR calc non Af Amer: 101 mL/min/{1.73_m2} (ref >59)
GFR, EST AFRICAN AMERICAN: 116 mL/min/{1.73_m2} (ref >59)
GLUCOSE: 191 mg/dL — AB (ref 65–99)
Globulin, Total: 2.7 g/dL (ref 1.5–4.5)
Potassium: 3.9 mmol/L (ref 3.5–5.2)
Sodium: 140 mmol/L (ref 134–144)
TOTAL PROTEIN: 7.1 g/dL (ref 6.0–8.5)

## 2016-05-27 LAB — LIPID PANEL
CHOL/HDL RATIO: 5.8 ratio — AB (ref 0.0–4.4)
Cholesterol, Total: 251 mg/dL — ABNORMAL HIGH (ref 100–199)
HDL: 43 mg/dL (ref >39)
LDL Calculated: 165 mg/dL — ABNORMAL HIGH (ref 0–99)
Triglycerides: 216 mg/dL — ABNORMAL HIGH (ref 0–149)
VLDL Cholesterol Cal: 43 mg/dL — ABNORMAL HIGH (ref 5–40)

## 2016-05-27 LAB — CBC WITH DIFFERENTIAL/PLATELET
BASOS ABS: 0.1 10*3/uL (ref 0.0–0.2)
Basos: 1 %
EOS (ABSOLUTE): 0.4 10*3/uL (ref 0.0–0.4)
Eos: 5 %
Hematocrit: 43.2 % (ref 34.0–46.6)
Hemoglobin: 14.1 g/dL (ref 11.1–15.9)
IMMATURE GRANS (ABS): 0.1 10*3/uL (ref 0.0–0.1)
Immature Granulocytes: 1 %
LYMPHS: 25 %
Lymphocytes Absolute: 2 10*3/uL (ref 0.7–3.1)
MCH: 29.8 pg (ref 26.6–33.0)
MCHC: 32.6 g/dL (ref 31.5–35.7)
MCV: 91 fL (ref 79–97)
MONOCYTES: 7 %
Monocytes Absolute: 0.6 10*3/uL (ref 0.1–0.9)
Neutrophils Absolute: 4.9 10*3/uL (ref 1.4–7.0)
Neutrophils: 61 %
Platelets: 373 10*3/uL (ref 150–379)
RBC: 4.73 x10E6/uL (ref 3.77–5.28)
RDW: 15 % (ref 12.3–15.4)
WBC: 8 10*3/uL (ref 3.4–10.8)

## 2016-05-27 LAB — MICROALBUMIN / CREATININE URINE RATIO
Creatinine, Urine: 30 mg/dL
MICROALB/CREAT RATIO: 24.7 mg/g{creat} (ref 0.0–30.0)
MICROALBUM., U, RANDOM: 7.4 ug/mL

## 2016-05-28 ENCOUNTER — Telehealth: Payer: Self-pay | Admitting: Family Medicine

## 2016-05-28 ENCOUNTER — Other Ambulatory Visit: Payer: Self-pay | Admitting: *Deleted

## 2016-05-28 DIAGNOSIS — E785 Hyperlipidemia, unspecified: Secondary | ICD-10-CM

## 2016-05-28 MED ORDER — ATORVASTATIN CALCIUM 20 MG PO TABS: 20.0000 mg | ORAL_TABLET | Freq: Every day | ORAL | 0 refills | Status: DC

## 2016-06-01 NOTE — Telephone Encounter (Signed)
Pt aware of lab results 

## 2016-06-09 ENCOUNTER — Other Ambulatory Visit: Payer: Self-pay | Admitting: Family Medicine

## 2016-06-17 ENCOUNTER — Encounter: Payer: BLUE CROSS/BLUE SHIELD | Admitting: *Deleted

## 2016-06-17 DIAGNOSIS — Z9289 Personal history of other medical treatment: Secondary | ICD-10-CM | POA: Diagnosis not present

## 2016-06-17 DIAGNOSIS — H5213 Myopia, bilateral: Secondary | ICD-10-CM | POA: Diagnosis not present

## 2016-06-17 DIAGNOSIS — Z1231 Encounter for screening mammogram for malignant neoplasm of breast: Secondary | ICD-10-CM | POA: Diagnosis not present

## 2016-06-17 LAB — HM DIABETES EYE EXAM

## 2016-06-18 ENCOUNTER — Telehealth: Payer: Self-pay | Admitting: Family Medicine

## 2016-06-18 MED ORDER — CLOTRIMAZOLE 10 MG MT TROC: 10.0000 mg | Freq: Every day | OROMUCOSAL | 0 refills | Status: DC

## 2016-06-18 MED ORDER — FLUTICASONE FUROATE-VILANTEROL 100-25 MCG/INH IN AEPB: 1.0000 | INHALATION_SPRAY | Freq: Every day | RESPIRATORY_TRACT | 11 refills | Status: DC

## 2016-06-18 NOTE — Telephone Encounter (Signed)
Patient aware of recommendations and she states that she rinses her mouth out after each use.

## 2016-06-18 NOTE — Addendum Note (Signed)
Addended by: Elenora GammaBRADSHAW, SAMUEL L on: 06/18/2016 02:08 PM   Modules accepted: Orders

## 2016-06-18 NOTE — Telephone Encounter (Signed)
Pt states Breo works better than Advair She would like to try lower strength on the Breo Please advise

## 2016-06-18 NOTE — Telephone Encounter (Signed)
Rx Sent at lower dose as requested. However, mouth rinsing is very important with any of these medications.   Murtis SinkSam Bradshaw, MD [facility] 06/18/2016, 2:07 PM

## 2016-06-18 NOTE — Telephone Encounter (Signed)
Breo and advair usually cause similar rates of thrush. Sent clotrimazole troche.   Would recommend thoroughly rinsing mouth with mouthwash after inhalation, I believe this will help the issue.   Spacers cant be used for breo or other powder inhalations.   Murtis SinkSam Jennah Satchell, MD [facility] 06/18/2016, 12:10 PM

## 2016-06-28 ENCOUNTER — Other Ambulatory Visit: Payer: Self-pay | Admitting: Family Medicine

## 2016-06-29 ENCOUNTER — Ambulatory Visit (INDEPENDENT_AMBULATORY_CARE_PROVIDER_SITE_OTHER): Payer: BLUE CROSS/BLUE SHIELD | Admitting: Pharmacist

## 2016-06-29 VITALS — BP 138/88 | HR 79 | Ht 67.0 in | Wt 164.0 lb

## 2016-06-29 DIAGNOSIS — E119 Type 2 diabetes mellitus without complications: Secondary | ICD-10-CM

## 2016-06-29 NOTE — Patient Instructions (Signed)
Diabetes and Standards of Medical Care   Diabetes is complicated. You may find that your diabetes team includes a dietitian, nurse, diabetes educator, eye doctor, and more. To help everyone know what is going on and to help you get the care you deserve, the following schedule of care was developed to help keep you on track. Below are the tests, exams, vaccines, medicines, education, and plans you will need.  Blood Glucose Goals Prior to meals = 80 - 130 Within 2 hours of the start of a meal = less than 180  Check your average blood glucose after 1 month - if still above goals then call and we can discuss other possible treatments.  HbA1c test (goal is less than 7.0% - your last value was 9.1%) This test shows how well you have controlled your glucose over the past 2 to 3 months. It is used to see if your diabetes management plan needs to be adjusted.   It is performed at least 2 times a year if you are meeting treatment goals.  It is performed 4 times a year if therapy has changed or if you are not meeting treatment goals.  Blood pressure test  This test is performed at every routine medical visit. The goal is less than 140/90 mmHg for most people, but 130/80 mmHg in some cases. Ask your health care provider about your goal.  Dental exam  Follow up with the dentist regularly.  Eye exam  If you are diagnosed with type 1 diabetes as a child, get an exam upon reaching the age of 43 years or older and have had diabetes for 3 to 5 years. Yearly eye exams are recommended after that initial eye exam.  If you are diagnosed with type 1 diabetes as an adult, get an exam within 5 years of diagnosis and then yearly.  If you are diagnosed with type 2 diabetes, get an exam as soon as possible after the diagnosis and then yearly.  Foot care exam  Visual foot exams are performed at every routine medical visit. The exams check for cuts, injuries, or other problems with the feet.  A comprehensive  foot exam should be done yearly. This includes visual inspection as well as assessing foot pulses and testing for loss of sensation.  Check your feet nightly for cuts, injuries, or other problems with your feet. Tell your health care provider if anything is not healing.  Kidney function test (urine microalbumin)  This test is performed once a year.  Type 1 diabetes: The first test is performed 5 years after diagnosis.  Type 2 diabetes: The first test is performed at the time of diagnosis.  A serum creatinine and estimated glomerular filtration rate (eGFR) test is done once a year to assess the level of chronic kidney disease (CKD), if present.  Lipid profile (cholesterol, HDL, LDL, triglycerides)  Performed every 5 years for most people.  The goal for LDL is less than 100 mg/dL. If you are at high risk, the goal is less than 70 mg/dL.  The goal for HDL is 40 mg/dL to 50 mg/dL for men and 50 mg/dL to 60 mg/dL for women. An HDL cholesterol of 60 mg/dL or higher gives some protection against heart disease.  The goal for triglycerides is less than 150 mg/dL.  Influenza vaccine, pneumococcal vaccine, and hepatitis B vaccine  The influenza vaccine is recommended yearly.  The pneumococcal vaccine is generally given once in a lifetime. However, there are some instances when  another vaccination is recommended. Check with your health care provider.  The hepatitis B vaccine is also recommended for adults with diabetes.  Diabetes self-management education  Education is recommended at diagnosis and ongoing as needed.  Treatment plan  Your treatment plan is reviewed at every medical visit.  Document Released: 03/01/2009 Document Revised: 01/04/2013 Document Reviewed: 10/04/2012 [facility] Patient Information 2014 [place].

## 2016-06-29 NOTE — Progress Notes (Signed)
Patient ID: Brittany Jenkins, female   DOB: Oct 15, 1965, 51 y.o.   MRN: [id number]   Subjective:    Brittany PentonKimberly Jenkins is a 51 y.o. female who presents for a follow up evaluation of Type 2 diabetes mellitus.  Current symptoms/problems include hyperglycemia and have been worsening. Symptoms have been present for 3 months. I have seen patient for diabetes education in the past but has been over 3 years since our last visit.  She was diagnosed with type 2 DM around 2005. She has had difficulty tolerating higher than 500mg  of metformin in the past but recently Janumet XR 100/1000mg  1 tablet daily was started and she is tolerating OK though she reports increase in GERD. She is also taking glipizide 5mg  tid.   She has tried Victoza in past but stopped due to nausea  Known diabetic complications: none Cardiovascular risk factors: diabetes mellitus, dyslipidemia, hypertension, obesity (BMI >= 30 kg/m2) and sedentary lifestyle  Eye exam current (within one year): yes Weight trend: stable Prior visit with CDE: yes - but has been many years ago Current diet: in general, an "unhealthy" diet;  Breakfast - grits, sandwich, yogurt  Lunch - sandwich, fruit and pretzels  Supper - meat like chicken or BBQ pork chops, vegetables and bread  Snack - sweets a little  Drinks - water or tea; coffee with Sweet n Low Current exercise: walking usually about 30 minutes per day but due to tax season is only walking 1 or 2 days per week Medication Compliance?  Yes  Current monitoring regimen: home blood tests - 1-2 times daily Home blood sugar records: ranges from 234 to 236 Any episodes of hypoglycemia? no  Is She on ACE inhibitor or angiotensin II receptor blocker?  Yes  lisinopril (Zestril)    The following portions of the patient's history were reviewed and updated as appropriate: allergies, current medications, past family history, past medical history, past social history, past surgical history and problem  list.    Objective:    There were no vitals taken for this visit.  Lab Review Glucose (mg/dL)  Date Value  40/98/119101/01/2017 191 (H)  04/24/2015 200 (H)  10/22/2014 116 (H)   CO2 (mmol/L)  Date Value  05/26/2016 25  04/24/2015 25  10/22/2014 23   BUN (mg/dL)  Date Value  47/82/956201/01/2017 10  04/24/2015 7  10/22/2014 10   Creatinine, Ser (mg/dL)  Date Value  13/08/657801/01/2017 0.68  04/24/2015 0.62  10/22/2014 0.65   A1c = 9.1% (05/26/2016)   Assessment:    Diabetes Mellitus type II, under inadequate control.    Plan:    1.  Rx changes: none - discussed retrying GLP1 agonist - patient to consider 2.  Education: Reviewed 'ABCs' of diabetes management (respective goals in parentheses):  A1C (<7), blood pressure (<130/80), and cholesterol (LDL <100). 3. Discussed pathophysiology of DM; difference between type 1 and type 2 DM. 4. CHO counting diet discussed.  Reviewed CHO amount in various foods and how to read nutrition labels.  Discussed recommended serving sizes.  5.  Recommend check BG 2  times a day 6.  Recommended increase physical activity - goal is 150 minutes per week 7. Follow up: 1 month

## 2016-06-30 ENCOUNTER — Encounter: Payer: Self-pay | Admitting: Pharmacist

## 2016-07-14 ENCOUNTER — Telehealth: Payer: Self-pay | Admitting: Family Medicine

## 2016-07-14 MED ORDER — CLOTRIMAZOLE 10 MG MT TROC: 10.0000 mg | Freq: Every day | OROMUCOSAL | 0 refills | Status: DC

## 2016-07-14 MED ORDER — FLUTICASONE-SALMETEROL 100-50 MCG/DOSE IN AEPB: 1.0000 | INHALATION_SPRAY | Freq: Two times a day (BID) | RESPIRATORY_TRACT | 1 refills | Status: DC

## 2016-07-14 NOTE — Telephone Encounter (Signed)
Patient states thrush / yeast injection has returned.  She has been rinsing mouth after Breo inhalations.  She states she didn't have thrush frequently with Advair and would like switch back.  Though one inhaler is not know to cause thrush more or less than the other.   Discussed with Prudy FeelerAngel Jones who is covering fro Dr Ermalinda MemosBradshaw today.  Will switch back to Advair 100/2350mcg 1 inhalation bid and also give anouther 7 days course of clotrimazole.  Patient to call office in no improvement in 7 days.

## 2016-07-20 ENCOUNTER — Other Ambulatory Visit: Payer: Self-pay

## 2016-07-20 MED ORDER — CLOTRIMAZOLE 10 MG MT TROC: 10.0000 mg | Freq: Every day | OROMUCOSAL | 0 refills | Status: DC

## 2016-07-31 ENCOUNTER — Telehealth: Payer: Self-pay | Admitting: Pharmacist

## 2016-07-31 NOTE — Telephone Encounter (Signed)
Patient called to report HGB readings.  She states that her average BG is the same.  Last A1c was elevated.  Recommended that she start Jardiance 10mg  1 tablet daily. I tried to contact patient but had to leave message - asked her to call me and I will leave samples if she is agreeable to starting Jardiance 10mg 

## 2016-08-25 ENCOUNTER — Other Ambulatory Visit: Payer: Self-pay | Admitting: *Deleted

## 2016-08-25 MED ORDER — GLIPIZIDE 5 MG PO TABS: 5.0000 mg | ORAL_TABLET | Freq: Three times a day (TID) | ORAL | 0 refills | Status: DC

## 2016-08-26 ENCOUNTER — Other Ambulatory Visit: Payer: Self-pay | Admitting: Family Medicine

## 2016-09-03 ENCOUNTER — Ambulatory Visit: Payer: BLUE CROSS/BLUE SHIELD | Admitting: Family Medicine

## 2016-09-22 ENCOUNTER — Other Ambulatory Visit: Payer: Self-pay | Admitting: Family Medicine

## 2016-10-26 ENCOUNTER — Other Ambulatory Visit: Payer: Self-pay | Admitting: Family Medicine

## 2016-10-30 ENCOUNTER — Ambulatory Visit (INDEPENDENT_AMBULATORY_CARE_PROVIDER_SITE_OTHER): Payer: BLUE CROSS/BLUE SHIELD | Admitting: Family Medicine

## 2016-10-30 ENCOUNTER — Encounter: Payer: Self-pay | Admitting: Family Medicine

## 2016-10-30 VITALS — BP 152/80 | HR 75 | Temp 97.4°F | Ht 67.0 in | Wt 162.6 lb

## 2016-10-30 DIAGNOSIS — E785 Hyperlipidemia, unspecified: Secondary | ICD-10-CM | POA: Diagnosis not present

## 2016-10-30 DIAGNOSIS — J4540 Moderate persistent asthma, uncomplicated: Secondary | ICD-10-CM | POA: Diagnosis not present

## 2016-10-30 DIAGNOSIS — E1165 Type 2 diabetes mellitus with hyperglycemia: Secondary | ICD-10-CM | POA: Diagnosis not present

## 2016-10-30 DIAGNOSIS — IMO0001 Reserved for inherently not codable concepts without codable children: Secondary | ICD-10-CM

## 2016-10-30 DIAGNOSIS — E119 Type 2 diabetes mellitus without complications: Secondary | ICD-10-CM | POA: Diagnosis not present

## 2016-10-30 DIAGNOSIS — L209 Atopic dermatitis, unspecified: Secondary | ICD-10-CM

## 2016-10-30 LAB — CMP14+EGFR
A/G RATIO: 1.8 (ref 1.2–2.2)
ALT: 27 IU/L (ref 0–32)
AST: 16 IU/L (ref 0–40)
Albumin: 4.4 g/dL (ref 3.5–5.5)
Alkaline Phosphatase: 62 IU/L (ref 39–117)
BILIRUBIN TOTAL: 0.3 mg/dL (ref 0.0–1.2)
BUN/Creatinine Ratio: 22 (ref 9–23)
BUN: 14 mg/dL (ref 6–24)
CHLORIDE: 102 mmol/L (ref 96–106)
CO2: 24 mmol/L (ref 20–29)
Calcium: 9.5 mg/dL (ref 8.7–10.2)
Creatinine, Ser: 0.63 mg/dL (ref 0.57–1.00)
GFR calc Af Amer: 119 mL/min/{1.73_m2} (ref >59)
GFR calc non Af Amer: 103 mL/min/{1.73_m2} (ref >59)
GLOBULIN, TOTAL: 2.5 g/dL (ref 1.5–4.5)
Glucose: 120 mg/dL — ABNORMAL HIGH (ref 65–99)
POTASSIUM: 3.5 mmol/L (ref 3.5–5.2)
SODIUM: 143 mmol/L (ref 134–144)
Total Protein: 6.9 g/dL (ref 6.0–8.5)

## 2016-10-30 LAB — LIPID PANEL
CHOL/HDL RATIO: 3.8 ratio (ref 0.0–4.4)
CHOLESTEROL TOTAL: 159 mg/dL (ref 100–199)
HDL: 42 mg/dL (ref >39)
LDL Calculated: 97 mg/dL (ref 0–99)
TRIGLYCERIDES: 98 mg/dL (ref 0–149)
VLDL CHOLESTEROL CAL: 20 mg/dL (ref 5–40)

## 2016-10-30 LAB — BAYER DCA HB A1C WAIVED: HB A1C (BAYER DCA - WAIVED): 8.1 % — ABNORMAL HIGH (ref <7.0)

## 2016-10-30 MED ORDER — GLIPIZIDE 5 MG PO TABS: 5.0000 mg | ORAL_TABLET | Freq: Three times a day (TID) | ORAL | 0 refills | Status: DC

## 2016-10-30 MED ORDER — LISINOPRIL-HYDROCHLOROTHIAZIDE 20-12.5 MG PO TABS: 1.0000 | ORAL_TABLET | Freq: Every day | ORAL | 1 refills | Status: DC

## 2016-10-30 MED ORDER — TRIAMCINOLONE ACETONIDE 0.5 % EX OINT: 1.0000 "application " | TOPICAL_OINTMENT | Freq: Two times a day (BID) | CUTANEOUS | 0 refills | Status: DC

## 2016-10-30 MED ORDER — EPINEPHRINE 0.3 MG/0.3ML IJ SOAJ: 0.3000 mg | Freq: Once | INTRAMUSCULAR | 3 refills | Status: AC

## 2016-10-30 MED ORDER — METFORMIN HCL 500 MG PO TABS: 500.0000 mg | ORAL_TABLET | Freq: Every day | ORAL | 3 refills | Status: DC

## 2016-10-30 MED ORDER — CLOTRIMAZOLE 10 MG MT TROC: 10.0000 mg | Freq: Every day | OROMUCOSAL | 0 refills | Status: DC

## 2016-10-30 NOTE — Patient Instructions (Signed)
Great to see you!  Try triamcinolone ointment on the rash, twice daily for up to 2 weeks then take a break

## 2016-10-30 NOTE — Progress Notes (Signed)
HPI  Patient presents today for follow-up chronic medical conditions.  Rash Left flank, itchy red rash responding to Claritin plus nystatin/Kenalog cream. Persistent, however not improved completely.  Diabetes Fasting blood sugar improved to 100 150 Evening blood sugar is 200s, most often low 200s. Patient can describe diabetic regimen easily. No hypoglycemia No foot numbness or tingling.  Hyperlipidemia Tolerates 10 mg of Lipitor, 20 mg causes myalgia.  Asthma Doing well with breo, still some thrush intermittently. No serious oral irritation currently, no white patchy lesions now  PMH: Smoking status noted ROS: Per HPI  Objective: BP (!) 152/80   Pulse 75   Temp 97.4 F (36.3 C) (Oral)   Ht _0  (1.702 m)   Wt 162 lb 9.6 oz (73.8 kg)   BMI 25.47 kg/m  Gen: NAD, alert, cooperative with exam HEENT: NCAT, no erythema or white discoloration of the oropharynx CV: RRR, good S1/S2, no murmur Resp: CTABL, no wheezes, non-labored Ext: No edema, warm Neuro: Alert and oriented, No gross deficits Skin Right flank with slightly erythematous papules located in a vertical line along the midaxillary line extending from the bra strap area down to the pelvic brim, no serious excoriation, drainage, or coalescing lesions  Diabetic Foot Exam - Simple   Simple Foot Form Diabetic Foot exam was performed with the following findings:  Yes 10/30/2016 10:10 AM  Visual Inspection No deformities, no ulcerations, no other skin breakdown bilaterally:  Yes Sensation Testing Intact to touch and monofilament testing bilaterally:  Yes Pulse Check Posterior Tibialis and Dorsalis pulse intact bilaterally:  Yes Comments      Assessment and plan:  # Uncontrolled diabetes A1c improved from 9.1-8.1, patient congratulated Continue current regimen, she feels that she has room for improvement in her diet. Foot exam as above  # Asthma Continue current regimen, patient with intermittent oral  irritation she attributes to thrush. Clotrimazole troche sent if needed, discussed thorough risning of the mouth after use ( with uncontrolled DM2 this may be difficult to prevent)  # Hyperlipidemia Repeat labs, patient tolerating 10 mg Lipitor, one half tab  # Atopic dermatitis Most likely etiology Continue Claritin, trial of Kenalog    Orders Placed This Encounter  Procedures  . Bayer DCA Hb A1c Waived  . Lipid panel  . CMP14+EGFR    Meds ordered this encounter  Medications  . BREO ELLIPTA 100-25 MCG/INH AEPB    Sig: INHALE 1 PUFF INTO THE LUNGS DAILY.    Refill:  9  . DISCONTD: metFORMIN (GLUCOPHAGE) 500 MG tablet    Sig: Take by mouth at bedtime.  Marland Kitchen glipiZIDE (GLUCOTROL) 5 MG tablet    Sig: Take 1 tablet (5 mg total) by mouth 3 (three) times daily.    Dispense:  270 tablet    Refill:  0  . lisinopril-hydrochlorothiazide (PRINZIDE,ZESTORETIC) 20-12.5 MG tablet    Sig: Take 1 tablet by mouth daily.    Dispense:  90 tablet    Refill:  1  . EPINEPHrine 0.3 mg/0.3 mL IJ SOAJ injection    Sig: Inject 0.3 mLs (0.3 mg total) into the muscle once.    Dispense:  2 Device    Refill:  3  . triamcinolone ointment (KENALOG) 0.5 %    Sig: Apply 1 application topically 2 (two) times daily.    Dispense:  30 g    Refill:  0  . clotrimazole (MYCELEX) 10 MG troche    Sig: Take 1 tablet (10 mg total) by mouth 5 (five)  times daily.    Dispense:  35 tablet    Refill:  0  . metFORMIN (GLUCOPHAGE) 500 MG tablet    Sig: Take 1 tablet (500 mg total) by mouth at bedtime.    Dispense:  90 tablet    Refill:  [place], MD [place] 10/30/2016, 11:48 AM

## 2016-11-13 ENCOUNTER — Telehealth: Payer: Self-pay | Admitting: Family Medicine

## 2016-11-13 NOTE — Telephone Encounter (Signed)
Pt questioning how long does she need to wait before resuming steroid cream Eczema is some better, but not resolved Pt was told to use cream only 2 weeks Please advise

## 2016-11-13 NOTE — Telephone Encounter (Signed)
Patient aware of instructions.  

## 2016-11-13 NOTE — Telephone Encounter (Signed)
I think we can safely extend it to 4 weeks treatment, if remission is achieved then 1 application twice a week will usually keep symptoms at bay.   I would recommend a 1-2 week break between long term daily applications.   I would be glad also to send her to derm if she does not get satisfactory results.   Murtis SinkSam Bradshaw, MD [facility] 11/13/2016, 12:00 PM

## 2016-11-17 ENCOUNTER — Other Ambulatory Visit: Payer: BLUE CROSS/BLUE SHIELD

## 2016-11-17 DIAGNOSIS — Z1211 Encounter for screening for malignant neoplasm of colon: Secondary | ICD-10-CM

## 2016-11-19 LAB — FECAL OCCULT BLOOD, IMMUNOCHEMICAL: Fecal Occult Bld: NEGATIVE

## 2016-11-30 ENCOUNTER — Other Ambulatory Visit: Payer: Self-pay | Admitting: Family Medicine

## 2016-12-17 ENCOUNTER — Ambulatory Visit (INDEPENDENT_AMBULATORY_CARE_PROVIDER_SITE_OTHER): Payer: BLUE CROSS/BLUE SHIELD | Admitting: Pediatrics

## 2016-12-17 ENCOUNTER — Encounter: Payer: Self-pay | Admitting: Pediatrics

## 2016-12-17 VITALS — BP 136/84 | HR 80 | Temp 98.5°F | Ht 67.0 in | Wt 165.0 lb

## 2016-12-17 DIAGNOSIS — J029 Acute pharyngitis, unspecified: Secondary | ICD-10-CM | POA: Diagnosis not present

## 2016-12-17 DIAGNOSIS — J329 Chronic sinusitis, unspecified: Secondary | ICD-10-CM

## 2016-12-17 LAB — RAPID STREP SCREEN (MED CTR MEBANE ONLY): Strep Gp A Ag, IA W/Reflex: NEGATIVE

## 2016-12-17 LAB — CULTURE, GROUP A STREP

## 2016-12-17 MED ORDER — AMOXICILLIN-POT CLAVULANATE 875-125 MG PO TABS: 1.0000 | ORAL_TABLET | Freq: Two times a day (BID) | ORAL | 0 refills | Status: DC

## 2016-12-17 NOTE — Patient Instructions (Addendum)
Netipot with distilled water 2-3 times a day to clear out sinuses Or Normal saline nasal spray Flonase steroid nasal spray Ibuprofen 600mg  three times a day as needed Continue claritin Lots of fluids  Start antibiotic if you start to get worse, have fevers, sinus pressure is worsening, not improving in 2-3 days.

## 2016-12-17 NOTE — Progress Notes (Signed)
  Subjective:   Patient ID: Brittany Jenkins, female    DOB: 1966/01/30, 51 y.o.   MRN: [id number] CC: Facial Pain; Sore Throat; and Ear Pain  HPI: Brittany PentonKimberly Jenkins is a 51 y.o. female presenting for Facial Pain; Sore Throat; and Ear Pain  For about a week has pressure L ear Has had a couple of dizzy spells, once husband had to catch her Says "the ground came up to her" she was bending over messing with her dog, then felt off when she stood up Past few mornings has woken up with very sore throat Appetite is fine Pressure in her sinuses, some tooth pain for last several days  Relevant past medical, surgical, family and social history reviewed. Allergies and medications reviewed and updated. History  Smoking Status  . Former Smoker  . Types: Cigarettes  . Quit date: 05/18/1998  Smokeless Tobacco  . Never Used   ROS: Per HPI   Objective:    BP 136/84   Pulse 80   Temp 98.5 F (36.9 C) (Oral)   Ht 5\' 7"  (1.702 m)   Wt 165 lb (74.8 kg)   BMI 25.84 kg/m   Wt Readings from Last 3 Encounters:  12/17/16 165 lb (74.8 kg)  10/30/16 162 lb 9.6 oz (73.8 kg)  06/29/16 164 lb (74.4 kg)    Gen: NAD, alert, cooperative with exam, NCAT EYES: EOMI, no conjunctival injection, or no icterus ENT:  TMs dull b/l, OP with erythema LYMPH: no cervical LAD CV: NRRR, normal S1/S2, no murmur, distal pulses 2+ b/l Resp: CTABL, no wheezes, normal WOB Abd: +BS, soft, NTND. no guarding or organomegaly Ext: No edema, warm Neuro: Alert and oriented  Assessment & Plan:  Brittany BradfordKimberly was seen today for facial pain, sore throat and ear pain.  Diagnoses and all orders for this visit:  Sinusitis, unspecified chronicity, unspecified location Flonase, antihistamine, neti pot Likely viral If not improving next few days, for any worsening symptoms start below -     amoxicillin-clavulanate (AUGMENTIN) 875-125 MG tablet; Take 1 tablet by mouth 2 (two) times daily.  Sore throat Rapid neg, will f/u  culture Discussed symptoms care -     Rapid strep screen (not at [facility]) -     Culture, Group A Strep   Follow up plan: Return if symptoms worsen or fail to improve. Rex Krasarol Graci Hulce, MD [facility]

## 2016-12-20 LAB — CULTURE, GROUP A STREP: Strep A Culture: NEGATIVE

## 2017-01-02 ENCOUNTER — Other Ambulatory Visit: Payer: Self-pay | Admitting: Family Medicine

## 2017-01-25 ENCOUNTER — Other Ambulatory Visit: Payer: Self-pay | Admitting: Family Medicine

## 2017-02-18 ENCOUNTER — Other Ambulatory Visit: Payer: Self-pay | Admitting: Family Medicine

## 2017-03-08 ENCOUNTER — Encounter: Payer: Self-pay | Admitting: Pediatrics

## 2017-03-08 ENCOUNTER — Ambulatory Visit (INDEPENDENT_AMBULATORY_CARE_PROVIDER_SITE_OTHER): Payer: BLUE CROSS/BLUE SHIELD | Admitting: Pediatrics

## 2017-03-08 VITALS — BP 133/80 | HR 79 | Temp 97.8°F | Ht 67.0 in | Wt 164.0 lb

## 2017-03-08 DIAGNOSIS — Z0000 Encounter for general adult medical examination without abnormal findings: Secondary | ICD-10-CM | POA: Diagnosis not present

## 2017-03-08 DIAGNOSIS — N950 Postmenopausal bleeding: Secondary | ICD-10-CM

## 2017-03-08 NOTE — Progress Notes (Signed)
  Subjective:   Patient ID: Brittany Jenkins, female    DOB: 29-Mar-1966, 51 y.o.   MRN: [id number] CC: annual exam  HPI: Brittany PentonKimberly Jenkins is a 51 y.o. female presenting for annual exam  Pap smear: last 5 yrs ago H/o of fibroids LMP 08/2015 One episode of 2 days of spotting 2 months ago Like the end of a period  No abd pain Appetite has been fine  Mood has been fine  Colon cancer screening: doing yearly annual FIT test  Mammogram due in January Has nodular breast tissue at baseline  Recently at times has felt like she cannot completely empty her bladder  Relevant past medical, surgical, family and social history reviewed. Allergies and medications reviewed and updated. History  Smoking Status  . Former Smoker  . Types: Cigarettes  . Quit date: 05/18/1998  Smokeless Tobacco  . Never Used   ROS: All systems normal other than what is in HPI  Objective:    BP 133/80   Pulse 79   Temp 97.8 F (36.6 C) (Oral)   Ht 5\' 7"  (1.702 m)   Wt 164 lb (74.4 kg)   LMP 09/07/2015   BMI 25.69 kg/m   Wt Readings from Last 3 Encounters:  03/08/17 164 lb (74.4 kg)  12/17/16 165 lb (74.8 kg)  10/30/16 162 lb 9.6 oz (73.8 kg)    Gen: NAD, alert, cooperative with exam, NCAT EYES: EOMI, no conjunctival injection, or no icterus ENT:  TMs pearly gray b/l, OP without erythema LYMPH: no cervical LAD CV: NRRR, normal S1/S2, no murmur, distal pulses 2+ b/l Resp: CTABL, no wheezes, normal WOB Abd: +BS, soft, NTND. no guarding or organomegaly Ext: No edema, warm Neuro: Alert and oriented, strength equal b/l UE and LE, coordination grossly normal MSK: normal muscle bulk Breast: R breast distal part of areola with deep nodularity to breast, no discrete masses GU: nl appearing external female genitalia, nl appearing vaginal ruggae, no discharge in cervix Skin: blanching red patchy rash over abd, some pink papuples, poorly demarcated  No lichenification Some excoriations present  Assessment &  Plan:  Brittany Jenkins was seen today for annual exam  Diagnoses and all orders for this visit:  Encounter for preventive care -     Hepatitis C antibody; Future -     Pap IG and HPV (high risk) DNA detection  Post-menopausal bleeding -     Ambulatory referral to Gynecology   Follow up plan: F/u as scheduled with PCP for DM2 f/u Rex Krasarol Ebbie Sorenson, MD [facility]

## 2017-03-10 LAB — PAP IG AND HPV HIGH-RISK
HPV, HIGH-RISK: NEGATIVE
PAP SMEAR COMMENT: 0

## 2017-03-11 ENCOUNTER — Other Ambulatory Visit: Payer: Self-pay | Admitting: Family Medicine

## 2017-03-28 ENCOUNTER — Other Ambulatory Visit: Payer: Self-pay | Admitting: Family Medicine

## 2017-04-13 DIAGNOSIS — N950 Postmenopausal bleeding: Secondary | ICD-10-CM | POA: Diagnosis not present

## 2017-04-18 IMAGING — US US ABDOMEN COMPLETE
1 series · 13 of 25 positions shown · non-contrast
Comparison: None.

CLINICAL DATA: Abdominal pain with nausea

EXAM:
ULTRASOUND ABDOMEN COMPLETE

[Series 1: us abdomen complete · 0.14mm/px · 13 of 117 slices shown]
[im 1/117]
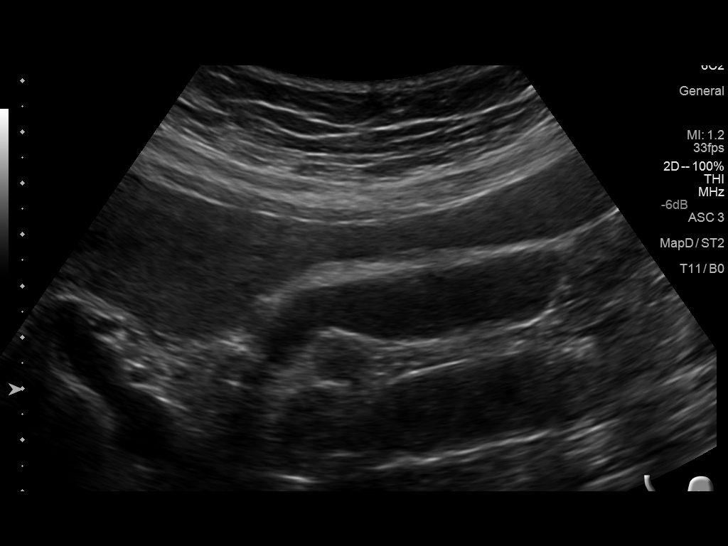
[im 10/117]
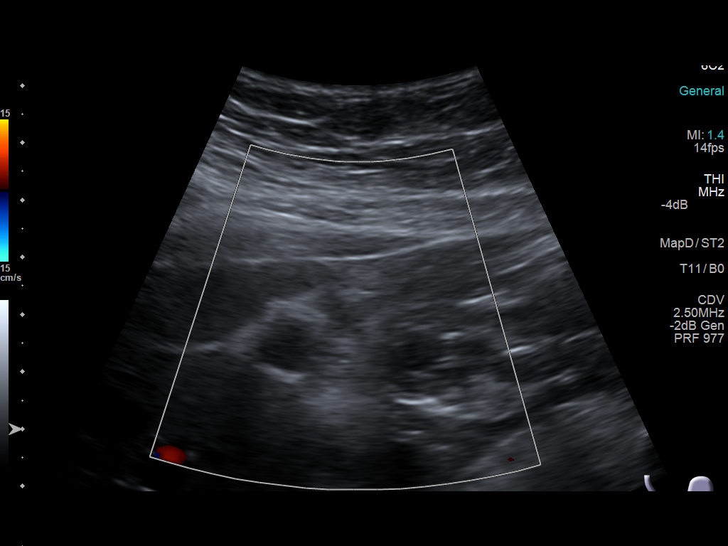
[im 20/117]
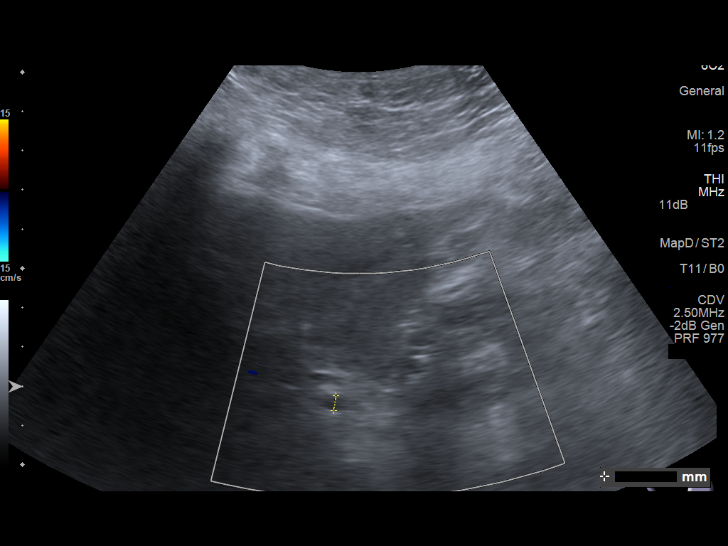
[im 30/117]
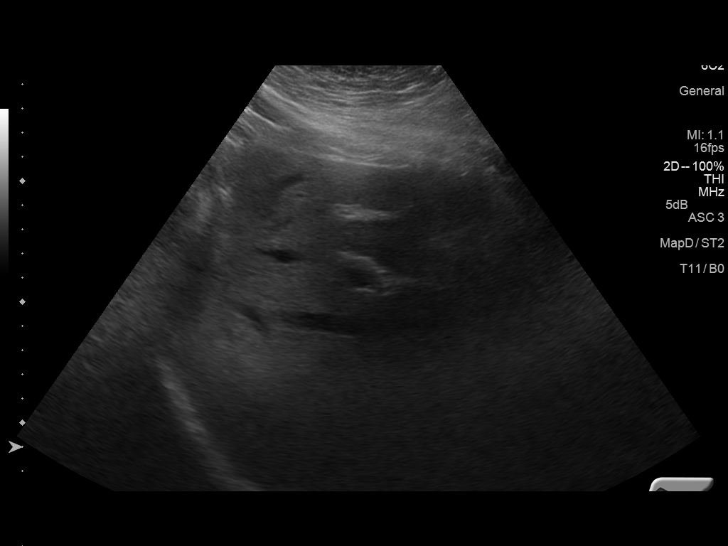
[im 39/117]
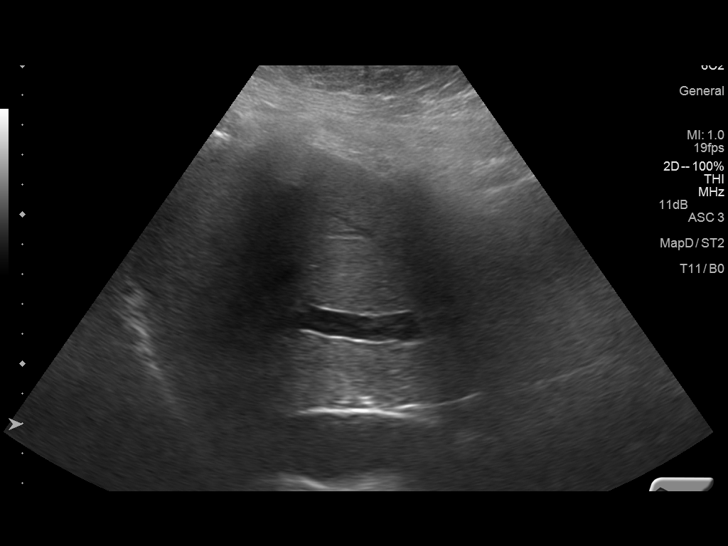
[im 49/117]
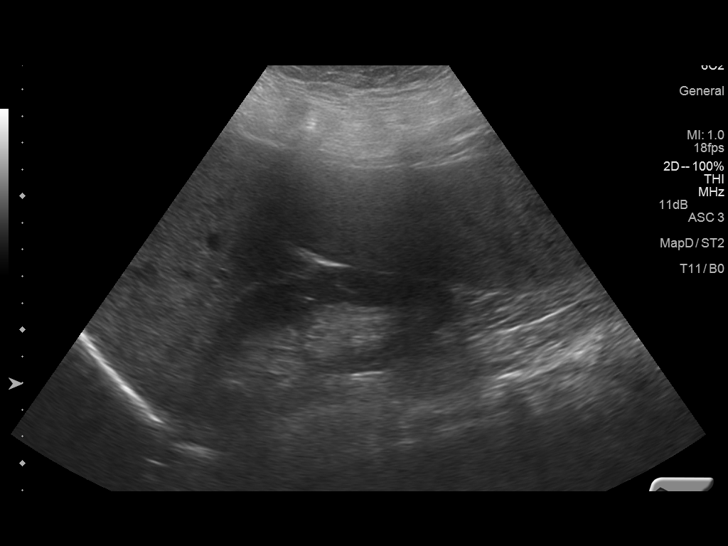
[im 59/117]
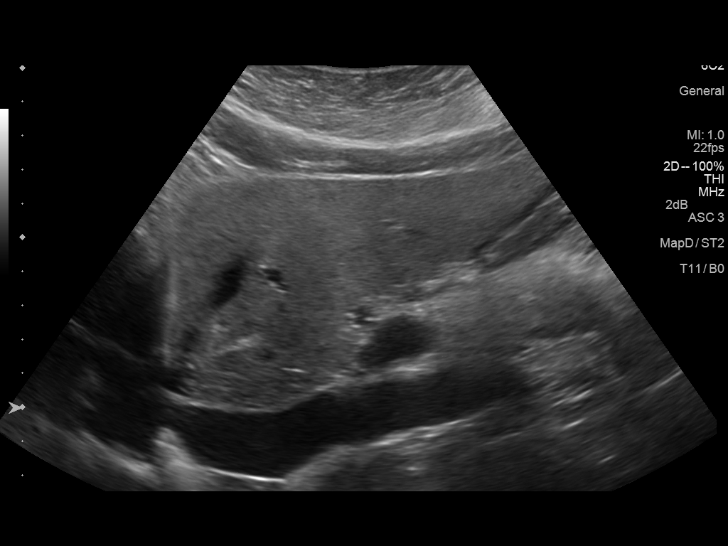
[im 68/117]
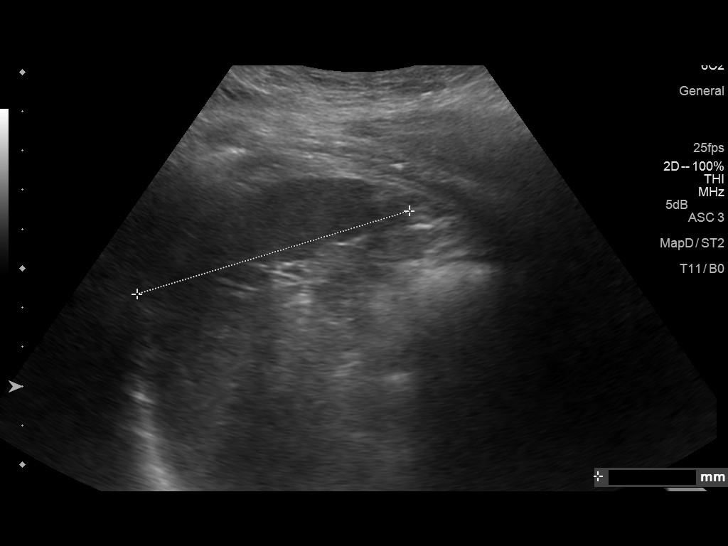
[im 78/117]
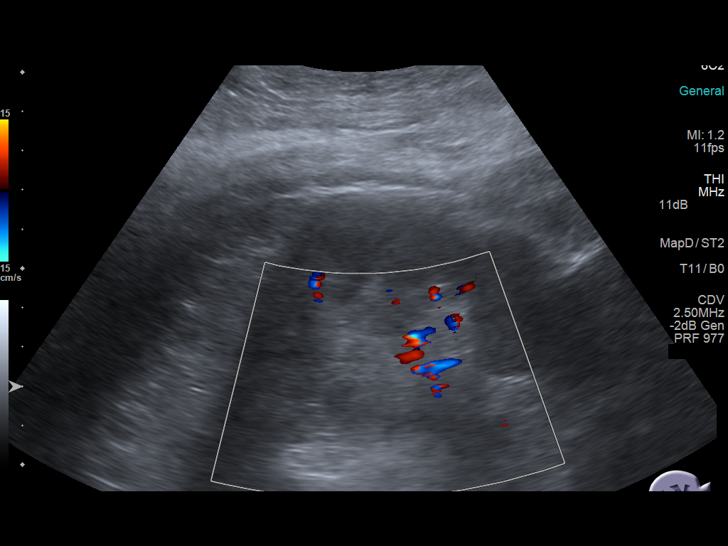
[im 88/117]
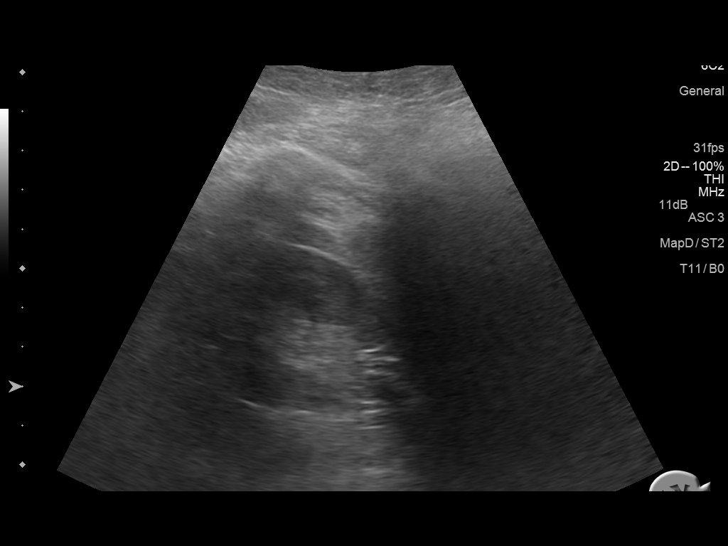
[im 97/117]
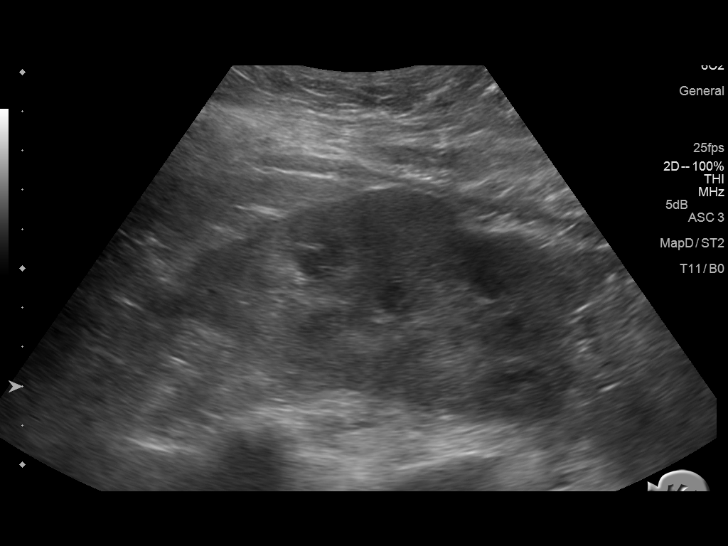
[im 107/117]
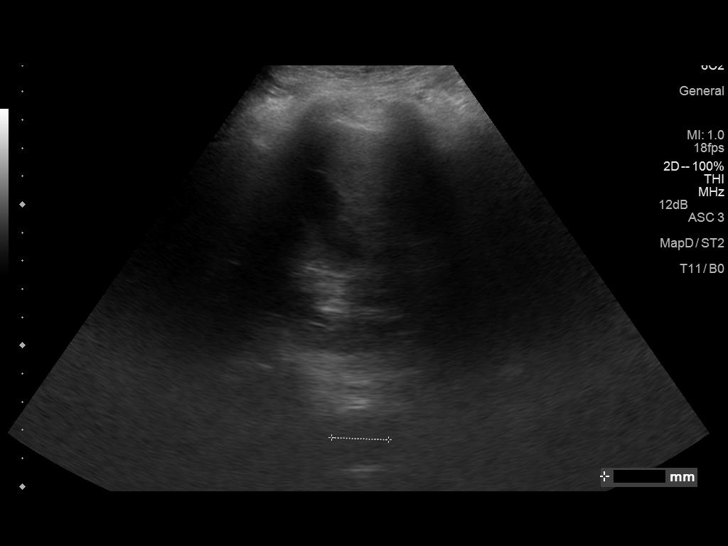
[im 117/117]
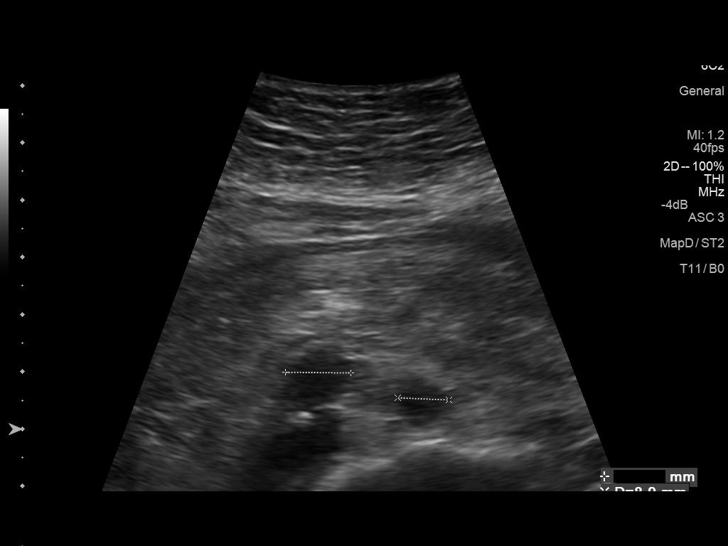

[13 of 25 positions shown; findings below may reference images not displayed]

FINDINGS: Gallbladder: No gallstones or wall thickening visualized. There is
no pericholecystic fluid. No sonographic Murphy sign noted.

Common bile duct: Diameter: 4 mm. There is no intrahepatic, common
hepatic, or common bile duct dilatation.

Liver: No focal lesion identified. Liver echogenicity overall is
increased. Liver is prominent measuring 20.7 cm in length.

IVC: No abnormality visualized.

Pancreas: No mass or inflammatory focus.

Spleen: Size and appearance within normal limits.

Right Kidney: Length: 12.1 cm. Echogenicity within normal limits. No
mass or hydronephrosis visualized.

Left Kidney: Length: 12.2 cm. Echogenicity within normal limits. No
mass or hydronephrosis visualized.

Abdominal aorta: No aneurysm visualized.

Other findings: No demonstrable ascites.
IMPRESSION: Prominent liver with increased liver echogenicity, a finding most
likely due to hepatic steatosis. While no focal liver lesions are
identified, it must be cautioned that the sensitivity of ultrasound
for focal liver lesions is diminished in this circumstance. Study
otherwise unremarkable.

## 2017-04-27 ENCOUNTER — Other Ambulatory Visit: Payer: Self-pay | Admitting: Family Medicine

## 2017-04-29 ENCOUNTER — Ambulatory Visit: Payer: BLUE CROSS/BLUE SHIELD | Admitting: Family Medicine

## 2017-04-29 DIAGNOSIS — D219 Benign neoplasm of connective and other soft tissue, unspecified: Secondary | ICD-10-CM | POA: Diagnosis not present

## 2017-04-29 DIAGNOSIS — N950 Postmenopausal bleeding: Secondary | ICD-10-CM | POA: Diagnosis not present

## 2017-05-03 ENCOUNTER — Ambulatory Visit: Payer: BLUE CROSS/BLUE SHIELD | Admitting: Family Medicine

## 2017-05-13 ENCOUNTER — Other Ambulatory Visit: Payer: Self-pay | Admitting: *Deleted

## 2017-05-13 MED ORDER — ATORVASTATIN CALCIUM 20 MG PO TABS: 20.0000 mg | ORAL_TABLET | Freq: Every day | ORAL | 1 refills | Status: DC

## 2017-05-25 ENCOUNTER — Other Ambulatory Visit: Payer: Self-pay | Admitting: *Deleted

## 2017-05-25 MED ORDER — LISINOPRIL-HYDROCHLOROTHIAZIDE 20-12.5 MG PO TABS: 1.0000 | ORAL_TABLET | Freq: Every day | ORAL | 0 refills | Status: DC

## 2017-05-31 ENCOUNTER — Other Ambulatory Visit: Payer: Self-pay | Admitting: Family Medicine

## 2017-06-07 ENCOUNTER — Other Ambulatory Visit: Payer: Self-pay | Admitting: Physician Assistant

## 2017-06-07 NOTE — Telephone Encounter (Signed)
Last seen 03/08/17  Dr Oswaldo DoneVincent   Dr Ermalinda MemosBradshaw PCP

## 2017-06-22 ENCOUNTER — Other Ambulatory Visit: Payer: Self-pay | Admitting: Family Medicine

## 2017-06-26 ENCOUNTER — Other Ambulatory Visit: Payer: Self-pay | Admitting: Family Medicine

## 2017-07-03 ENCOUNTER — Other Ambulatory Visit: Payer: Self-pay | Admitting: Family Medicine

## 2017-07-07 ENCOUNTER — Encounter: Payer: Self-pay | Admitting: Family Medicine

## 2017-07-07 ENCOUNTER — Other Ambulatory Visit: Payer: Self-pay | Admitting: Family Medicine

## 2017-07-07 ENCOUNTER — Ambulatory Visit: Payer: BLUE CROSS/BLUE SHIELD | Admitting: Family Medicine

## 2017-07-07 ENCOUNTER — Ambulatory Visit (INDEPENDENT_AMBULATORY_CARE_PROVIDER_SITE_OTHER): Payer: BLUE CROSS/BLUE SHIELD

## 2017-07-07 VITALS — BP 142/73 | HR 90 | Temp 97.2°F | Ht 67.0 in | Wt 163.8 lb

## 2017-07-07 DIAGNOSIS — J4540 Moderate persistent asthma, uncomplicated: Secondary | ICD-10-CM

## 2017-07-07 DIAGNOSIS — IMO0001 Reserved for inherently not codable concepts without codable children: Secondary | ICD-10-CM

## 2017-07-07 DIAGNOSIS — E1165 Type 2 diabetes mellitus with hyperglycemia: Secondary | ICD-10-CM | POA: Diagnosis not present

## 2017-07-07 DIAGNOSIS — Z780 Asymptomatic menopausal state: Secondary | ICD-10-CM

## 2017-07-07 DIAGNOSIS — R05 Cough: Secondary | ICD-10-CM | POA: Diagnosis not present

## 2017-07-07 LAB — BAYER DCA HB A1C WAIVED: HB A1C (BAYER DCA - WAIVED): 8.4 % — ABNORMAL HIGH (ref <7.0)

## 2017-07-07 MED ORDER — SITAGLIPTIN PHOS-METFORMIN HCL 50-1000 MG PO TABS: 1.0000 | ORAL_TABLET | Freq: Two times a day (BID) | ORAL | 5 refills | Status: DC

## 2017-07-07 MED ORDER — FLUTICASONE-UMECLIDIN-VILANT 100-62.5-25 MCG/INH IN AEPB: 1.0000 | INHALATION_SPRAY | Freq: Every day | RESPIRATORY_TRACT | 0 refills | Status: DC

## 2017-07-07 NOTE — Patient Instructions (Signed)
Great to see you!  Discontinue Janumet XR and try Janumet twice daily

## 2017-07-07 NOTE — Progress Notes (Signed)
   HPI  Patient presents today here to follow up for diabetes and to discuss cough.  Asthma Patient states that on previous PFTs she was told she has asthma and a small amount of COPD. She started breo feels much better, however states that over the last year or so she is developed a persistent productive cough with a feeling of chest congestion in her mid chest.  Diabetes Watching her diet very closely Average fasting blood sugar is 120-150 Good medication compliance with Janumet plus metformin plus glipizide. Recent A1c at [facility] was 7.2.  PMH: Smoking status noted ROS: Per HPI  Objective: BP (!) 142/73   Pulse 90   Temp (!) 97.2 F (36.2 C) (Oral)   Ht _0  (1.702 m)   Wt 163 lb 12.8 oz (74.3 kg)   LMP 09/07/2015   SpO2 96%   BMI 25.65 kg/m  Gen: NAD, alert, cooperative with exam HEENT: NCAT CV: RRR, good S1/S2, no murmur Resp: CTABL, no wheezes, non-labored Ext: No edema, warm Neuro: Alert and oriented, No gross deficits  Assessment and plan:  # Asthma Possible secondary component of chronic obstruction  Plain film Samples of Trelegy given to replace breo for 1 month to see if she has improvement.   # T2Dm Unconctrolled,  She was not tolerating eveng metformin so I have changed Janumet XR once daily to 50/1000 BID.  ( increasing metformin to 2 G daily) F/u 3-4 months   Orders Placed This Encounter  Procedures  . DG Chest 2 View    Standing Status:   Future    Number of Occurrences:   1    Standing Expiration Date:   09/05/2018    Order Specific Question:   Reason for Exam (SYMPTOM  OR DIAGNOSIS REQUIRED)    Answer:   cough, chronic    Order Specific Question:   Is patient pregnant?    Answer:   No    Order Specific Question:   Preferred imaging location?    Answer:   Internal    Order Specific Question:   Radiology Contrast Protocol - do NOT remove file path    Answer:   [email or web address]  . Bayer DCA Hb A1c  Waived  . CMP14+EGFR  . CBC with Differential/Platelet    Meds ordered this encounter  Medications  . sitaGLIPtin-metformin (JANUMET) 50-1000 MG tablet    Sig: Take 1 tablet by mouth 2 (two) times daily with a meal.    Dispense:  60 tablet    Refill:  5  . Fluticasone-Umeclidin-Vilant (TRELEGY ELLIPTA) 100-62.5-25 MCG/INH AEPB    Sig: Inhale 1 puff into the lungs daily.    Dispense:  28 each    Refill:  0    Laroy Apple, MD [facility] 07/07/2017, 5:08 PM

## 2017-07-08 LAB — SPECIMEN STATUS REPORT

## 2017-07-09 LAB — CMP14+EGFR
A/G RATIO: 1.7 (ref 1.2–2.2)
ALT: 26 IU/L (ref 0–32)
AST: 17 IU/L (ref 0–40)
Albumin: 4.5 g/dL (ref 3.5–5.5)
Alkaline Phosphatase: 68 IU/L (ref 39–117)
BUN / CREAT RATIO: 12 (ref 9–23)
BUN: 8 mg/dL (ref 6–24)
Bilirubin Total: <0.2 mg/dL (ref 0.0–1.2)
CALCIUM: 9.9 mg/dL (ref 8.7–10.2)
CO2: 24 mmol/L (ref 20–29)
CREATININE: 0.66 mg/dL (ref 0.57–1.00)
Chloride: 100 mmol/L (ref 96–106)
GFR, EST AFRICAN AMERICAN: 117 mL/min/{1.73_m2} (ref >59)
GFR, EST NON AFRICAN AMERICAN: 101 mL/min/{1.73_m2} (ref >59)
GLOBULIN, TOTAL: 2.7 g/dL (ref 1.5–4.5)
Glucose: 247 mg/dL — ABNORMAL HIGH (ref 65–99)
Potassium: 3.7 mmol/L (ref 3.5–5.2)
SODIUM: 141 mmol/L (ref 134–144)
Total Protein: 7.2 g/dL (ref 6.0–8.5)

## 2017-07-09 LAB — CBC WITH DIFFERENTIAL/PLATELET
BASOS ABS: 0.1 10*3/uL (ref 0.0–0.2)
Basos: 1 %
EOS (ABSOLUTE): 0.3 10*3/uL (ref 0.0–0.4)
Eos: 3 %
HEMATOCRIT: 42.2 % (ref 34.0–46.6)
Hemoglobin: 13.9 g/dL (ref 11.1–15.9)
IMMATURE GRANS (ABS): 0 10*3/uL (ref 0.0–0.1)
Immature Granulocytes: 0 %
LYMPHS: 27 %
Lymphocytes Absolute: 2.3 10*3/uL (ref 0.7–3.1)
MCH: 29.8 pg (ref 26.6–33.0)
MCHC: 32.9 g/dL (ref 31.5–35.7)
MCV: 90 fL (ref 79–97)
Monocytes Absolute: 0.5 10*3/uL (ref 0.1–0.9)
Monocytes: 7 %
Neutrophils Absolute: 5.1 10*3/uL (ref 1.4–7.0)
Neutrophils: 62 %
Platelets: 401 10*3/uL — ABNORMAL HIGH (ref 150–379)
RBC: 4.67 x10E6/uL (ref 3.77–5.28)
RDW: 14.2 % (ref 12.3–15.4)
WBC: 8.3 10*3/uL (ref 3.4–10.8)

## 2017-07-13 ENCOUNTER — Encounter: Payer: Self-pay | Admitting: Family Medicine

## 2017-07-13 ENCOUNTER — Ambulatory Visit: Payer: BLUE CROSS/BLUE SHIELD | Admitting: Family Medicine

## 2017-07-13 VITALS — BP 140/80 | HR 76 | Temp 97.1°F | Ht 67.0 in | Wt 162.2 lb

## 2017-07-13 DIAGNOSIS — M791 Myalgia, unspecified site: Secondary | ICD-10-CM | POA: Diagnosis not present

## 2017-07-13 MED ORDER — TRAMADOL HCL 50 MG PO TABS: 50.0000 mg | ORAL_TABLET | Freq: Four times a day (QID) | ORAL | 0 refills | Status: DC | PRN

## 2017-07-13 NOTE — Patient Instructions (Signed)
Great to see you!  I think most likely you have been sick *( like the flu). I like the step of stopping trellegy, when you are better in 1-2 weeks then try trellegy again.   This could be your other medicine, Lipitor. If you do not begin to get better in a few days then we can try stopping that and doing labs. Just call if this happens

## 2017-07-13 NOTE — Progress Notes (Signed)
   HPI  Patient presents today  Here with muscle aches  Pt state that after our last visit she chanegd to trllegy and then began having unusual pains.   She denies any clear fever or illness but does have hot flashes and thinks this may be difficult to identify.   Sehe describes an initial episode of L mastoid pain that lasted about 1 day. She then developed R sided back pain dnd now BL muscle pan in the neck and back.   The mastoid pain and R sided back/flank pain has stopped.   She state s that she was suspicious it may have been trellegy that started it ans stopped it this am ( returned to breo), the medicine was helping her cough.   PMH: Smoking status noted ROS: Per HPI  Objective: BP 140/80   Pulse 76   Temp (!) 97.1 F (36.2 C) (Oral)   Ht 5\' 7"  (1.702 m)   Wt 162 lb 3.2 oz (73.6 kg)   LMP 09/07/2015   BMI 25.40 kg/m  Gen: NAD, alert, cooperative with exam HEENT: NCAT, no mastoid ttp CV: RRR, good S1/S2, no murmur Resp: CTABL, no wheezes, non-labored Ext: No edema, warm Neuro: Alert and oriented, No gross deficits  Assessment and plan:  # myalgia Most likiley unrecognized viral illness Possibly umeclidinium related ( 1% myalgia), so will stop trellegy and try again in about 2 weeks when she is better.  POssibly statin related, if persistent will stop this and check CMP, CK, and CBC     Meds ordered this encounter  Medications  . traMADol (ULTRAM) 50 MG tablet    Sig: Take 1 tablet (50 mg total) by mouth every 6 (six) hours as needed.    Dispense:  20 tablet    Refill:  0    Murtis SinkSam Jemery Stacey, MD [facility] 07/13/2017, 1:46 PM

## 2017-07-14 ENCOUNTER — Other Ambulatory Visit: Payer: Self-pay | Admitting: Family Medicine

## 2017-07-16 ENCOUNTER — Ambulatory Visit: Payer: BLUE CROSS/BLUE SHIELD | Admitting: Family Medicine

## 2017-07-23 ENCOUNTER — Telehealth: Payer: Self-pay | Admitting: *Deleted

## 2017-07-26 ENCOUNTER — Other Ambulatory Visit: Payer: Self-pay | Admitting: Family Medicine

## 2017-07-26 NOTE — Telephone Encounter (Signed)
Patient aware of recommendations.  

## 2017-07-26 NOTE — Telephone Encounter (Signed)
Will ask nursing to contact and review weekend BPs with her.   Murtis SinkSam Carlesha Seiple, MD [facility] 07/26/2017, 7:39 AM

## 2017-07-26 NOTE — Telephone Encounter (Signed)
Patient states that BP  Saturday  130/92-AM  146/103-lunch 138/93-pm Sunday 145/91-AM 131/83-lunch  138/92-pm Monday  150/91-Before BP meds 125/80-after BP meds.  Patient would like to know if she can take a 1/2 tablet - 1 tablet when BP is elevated

## 2017-07-26 NOTE — Telephone Encounter (Signed)
Pt with slightly uncontrolled BPs over the weekend  Wants to take extra 1/2 tab of prinzide for elevated BPs.   I would not recommend extra lisinopril/hctz for BP fluctuations as they are not rapid acting medications.   I would consider increasing the dose of lisinopril she is on however.   Increase to 1.5 tabs daily for 2 weeks then call back and see how it is going  Murtis SinkSam [facility], MD [facility] 07/26/2017, 11:57 AM

## 2017-07-28 ENCOUNTER — Telehealth: Payer: Self-pay | Admitting: Family Medicine

## 2017-07-28 ENCOUNTER — Encounter: Payer: Self-pay | Admitting: Family Medicine

## 2017-07-28 NOTE — Telephone Encounter (Signed)
Note wrote-placed up front. Patient aware of recommendation and verbalizes understanding. Please review.

## 2017-07-28 NOTE — Telephone Encounter (Signed)
She didn't sleep well.  Laying down and heart rate running around 105-110. Rarely under 100 since last night (in last 24 hours )  She can feel it beating fast.  Newly on Trelegy as well.  BP running some better the last few days: 125/89 135/90 130/83  (phone call from Friday - BP med change) Any suggestions?  She works for [employer] - stressful , can she have a note for today.

## 2017-07-28 NOTE — Telephone Encounter (Signed)
Yes, a note is fine.

## 2017-07-28 NOTE — Telephone Encounter (Signed)
Angel, any suggestions on meds and or Heart rate??

## 2017-07-28 NOTE — Telephone Encounter (Signed)
She can hold the trelegy, avoid caffeine. Pass this information to Dr. Ermalinda MemosBradshaw to review tomorrow.

## 2017-07-29 NOTE — Telephone Encounter (Signed)
Patient aware of recommendations.  

## 2017-07-29 NOTE — Telephone Encounter (Signed)
This is unlikely medication effect, was changed from breo to trellegy and anticholinergic shouldn't make that much of an impact.   Would recommend eval if continued and restart trellegy.   Murtis SinkSam Majour Frei, MD [facility] 07/29/2017, 7:46 AM

## 2017-08-23 ENCOUNTER — Other Ambulatory Visit: Payer: Self-pay | Admitting: Family Medicine

## 2017-08-29 ENCOUNTER — Other Ambulatory Visit: Payer: Self-pay | Admitting: Pediatrics

## 2017-09-07 ENCOUNTER — Ambulatory Visit: Payer: BLUE CROSS/BLUE SHIELD | Admitting: Family Medicine

## 2017-10-04 ENCOUNTER — Ambulatory Visit (INDEPENDENT_AMBULATORY_CARE_PROVIDER_SITE_OTHER): Payer: BLUE CROSS/BLUE SHIELD

## 2017-10-04 ENCOUNTER — Ambulatory Visit: Payer: BLUE CROSS/BLUE SHIELD | Admitting: Family Medicine

## 2017-10-04 ENCOUNTER — Encounter: Payer: Self-pay | Admitting: Family Medicine

## 2017-10-04 VITALS — BP 138/81 | HR 93 | Temp 98.3°F | Ht 67.0 in | Wt 161.2 lb

## 2017-10-04 DIAGNOSIS — Z780 Asymptomatic menopausal state: Secondary | ICD-10-CM

## 2017-10-04 DIAGNOSIS — E119 Type 2 diabetes mellitus without complications: Secondary | ICD-10-CM | POA: Diagnosis not present

## 2017-10-04 DIAGNOSIS — Z13820 Encounter for screening for osteoporosis: Secondary | ICD-10-CM | POA: Diagnosis not present

## 2017-10-04 LAB — BAYER DCA HB A1C WAIVED: HB A1C (BAYER DCA - WAIVED): 7.6 % — ABNORMAL HIGH (ref <7.0)

## 2017-10-04 NOTE — Progress Notes (Signed)
   HPI  Patient presents today here for follow-up diabetes.  Patient feels that she is doing pretty well, however states that her blood sugars have been a little bit worse over the last few weeks.  She is watching her diet. She reports blood sugars of 1 40-1 60 fasting on average. She denies hypoglycemia She is watching her diet She is tolerating Janumet well.   PMH: Smoking status noted ROS: Per HPI  Objective: BP 138/81   Pulse 93   Temp 98.3 F (36.8 C) (Oral)   Ht  (1.702 m)   Wt 161 lb 3.2 oz (73.1 kg)   LMP 09/07/2015   BMI 25.25 kg/m  Gen: NAD, alert, cooperative with exam HEENT: NCAT CV: RRR, good S1/S2, no murmur Resp: CTABL, no wheezes, non-labored Ext: No edema, warm Neuro: Alert and oriented, No gross deficits  Assessment and plan:  #Type 2 diabetes Controlled, no changes Meds plus glipizide Follow-up 3 months   Orders Placed This Encounter  Procedures  . Bayer [facility] Hb A1c Ellender Hose, MD [facility] 10/04/2017, 11:26 AM

## 2017-10-04 NOTE — Patient Instructions (Signed)
Great to see you!   

## 2017-10-05 ENCOUNTER — Telehealth: Payer: Self-pay | Admitting: Family Medicine

## 2017-10-05 NOTE — Telephone Encounter (Signed)
Patient aware of results.

## 2017-10-15 ENCOUNTER — Encounter: Payer: Self-pay | Admitting: *Deleted

## 2017-11-20 ENCOUNTER — Other Ambulatory Visit: Payer: Self-pay | Admitting: Family Medicine

## 2017-12-02 ENCOUNTER — Other Ambulatory Visit: Payer: Self-pay | Admitting: Family Medicine

## 2017-12-02 NOTE — Telephone Encounter (Signed)
Ov 01/10/18 

## 2018-01-04 ENCOUNTER — Ambulatory Visit: Payer: BLUE CROSS/BLUE SHIELD | Admitting: Family Medicine

## 2018-01-07 ENCOUNTER — Other Ambulatory Visit: Payer: Self-pay | Admitting: *Deleted

## 2018-01-07 MED ORDER — SITAGLIPTIN PHOS-METFORMIN HCL 50-1000 MG PO TABS: 1.0000 | ORAL_TABLET | Freq: Two times a day (BID) | ORAL | 0 refills | Status: DC

## 2018-01-10 ENCOUNTER — Ambulatory Visit: Payer: BLUE CROSS/BLUE SHIELD | Admitting: Family Medicine

## 2018-01-14 ENCOUNTER — Other Ambulatory Visit: Payer: Self-pay | Admitting: *Deleted

## 2018-01-14 MED ORDER — TRIAMCINOLONE ACETONIDE 0.5 % EX OINT
TOPICAL_OINTMENT | CUTANEOUS | 0 refills | Status: DC
Start: 2018-01-14 — End: 2019-11-23

## 2018-01-14 NOTE — Telephone Encounter (Signed)
Refilled.  Please make sure she is scheduling f/u for chronic issues since she was a bradshaw patient previously.

## 2018-02-10 ENCOUNTER — Other Ambulatory Visit: Payer: Self-pay | Admitting: *Deleted

## 2018-02-10 MED ORDER — FLUTICASONE FUROATE-VILANTEROL 100-25 MCG/INH IN AEPB: INHALATION_SPRAY | RESPIRATORY_TRACT | 0 refills | Status: DC

## 2018-02-17 ENCOUNTER — Other Ambulatory Visit: Payer: Self-pay | Admitting: Family Medicine

## 2018-02-24 ENCOUNTER — Other Ambulatory Visit: Payer: Self-pay | Admitting: Family Medicine

## 2018-02-24 NOTE — Telephone Encounter (Signed)
Last seen 10/04/17  Dr G 

## 2018-02-25 NOTE — Telephone Encounter (Signed)
lmtcb-cb 10/11 

## 2018-02-25 NOTE — Telephone Encounter (Signed)
Please have her schedule an appt with me since I have never seen her.  Follow up DM.

## 2018-03-03 NOTE — Telephone Encounter (Signed)
appt scheduled for 10/25

## 2018-03-08 ENCOUNTER — Other Ambulatory Visit: Payer: Self-pay

## 2018-03-08 MED ORDER — GLIPIZIDE 5 MG PO TABS: 5.0000 mg | ORAL_TABLET | Freq: Three times a day (TID) | ORAL | 0 refills | Status: DC

## 2018-03-08 NOTE — Telephone Encounter (Signed)
Last seen 09/2017

## 2018-03-08 NOTE — Telephone Encounter (Signed)
Needs OV for DM2.  Will send in 30 day supply.

## 2018-03-08 NOTE — Telephone Encounter (Signed)
Last seen 10/04/17   Dr Ermalinda Memos

## 2018-03-10 ENCOUNTER — Other Ambulatory Visit: Payer: Self-pay | Admitting: *Deleted

## 2018-03-10 MED ORDER — ALBUTEROL SULFATE HFA 108 (90 BASE) MCG/ACT IN AERS: INHALATION_SPRAY | RESPIRATORY_TRACT | 0 refills | Status: DC

## 2018-03-10 NOTE — Telephone Encounter (Signed)
lmtcb-cb 10/24 

## 2018-03-11 ENCOUNTER — Ambulatory Visit: Payer: BLUE CROSS/BLUE SHIELD | Admitting: Family Medicine

## 2018-03-15 DIAGNOSIS — Z1231 Encounter for screening mammogram for malignant neoplasm of breast: Secondary | ICD-10-CM | POA: Diagnosis not present

## 2018-03-15 LAB — HM MAMMOGRAPHY

## 2018-03-15 LAB — HM DIABETES EYE EXAM

## 2018-03-29 ENCOUNTER — Telehealth: Payer: Self-pay | Admitting: *Deleted

## 2018-03-29 MED ORDER — SITAGLIPTIN PHOS-METFORMIN HCL 50-1000 MG PO TABS: 1.0000 | ORAL_TABLET | Freq: Two times a day (BID) | ORAL | 0 refills | Status: DC

## 2018-03-29 NOTE — Telephone Encounter (Signed)
Gottschalk. 30 days given again. 10/25 appt cancelled

## 2018-04-05 DIAGNOSIS — Z8041 Family history of malignant neoplasm of ovary: Secondary | ICD-10-CM | POA: Diagnosis not present

## 2018-04-05 DIAGNOSIS — Z6826 Body mass index (BMI) 26.0-26.9, adult: Secondary | ICD-10-CM | POA: Diagnosis not present

## 2018-04-05 DIAGNOSIS — Z01419 Encounter for gynecological examination (general) (routine) without abnormal findings: Secondary | ICD-10-CM | POA: Diagnosis not present

## 2018-04-07 ENCOUNTER — Other Ambulatory Visit: Payer: Self-pay | Admitting: *Deleted

## 2018-04-07 MED ORDER — LISINOPRIL-HYDROCHLOROTHIAZIDE 20-12.5 MG PO TABS: 1.0000 | ORAL_TABLET | Freq: Every day | ORAL | 0 refills | Status: DC

## 2018-04-11 ENCOUNTER — Other Ambulatory Visit: Payer: Self-pay | Admitting: Family Medicine

## 2018-04-12 NOTE — Telephone Encounter (Signed)
Gottschalk. NTBS 30 days given 03/08/18.

## 2018-04-21 ENCOUNTER — Other Ambulatory Visit: Payer: Self-pay | Admitting: Family Medicine

## 2018-04-22 NOTE — Telephone Encounter (Signed)
Gottschalk. NTBS 30 days given 03/29/18 

## 2018-04-22 NOTE — Telephone Encounter (Signed)
Left message, need to schedule diabetic appointment.

## 2018-04-25 ENCOUNTER — Other Ambulatory Visit: Payer: Self-pay | Admitting: Family Medicine

## 2018-04-25 NOTE — Telephone Encounter (Signed)
Last seen 09/2017

## 2018-05-09 ENCOUNTER — Other Ambulatory Visit: Payer: Self-pay | Admitting: Family Medicine

## 2018-05-09 MED ORDER — SITAGLIPTIN PHOS-METFORMIN HCL 50-1000 MG PO TABS: 1.0000 | ORAL_TABLET | Freq: Two times a day (BID) | ORAL | 0 refills | Status: DC

## 2018-05-09 NOTE — Telephone Encounter (Signed)
Pt would like to start seeing Dr Oswaldo DoneVincent for PCP and she is out of Janumet, can we schedule her with you and send in rx till she can get in with you.

## 2018-05-09 NOTE — Telephone Encounter (Signed)
Gottschalk. NTBS 30 days given 03/29/18 

## 2018-05-09 NOTE — Addendum Note (Signed)
Addended by: Johna SheriffVINCENT, CAROL L on: 05/09/2018 05:41 PM   Modules accepted: Orders

## 2018-05-12 ENCOUNTER — Other Ambulatory Visit: Payer: Self-pay | Admitting: *Deleted

## 2018-05-15 ENCOUNTER — Other Ambulatory Visit: Payer: Self-pay | Admitting: Family Medicine

## 2018-05-16 ENCOUNTER — Encounter: Payer: Self-pay | Admitting: Family Medicine

## 2018-05-16 ENCOUNTER — Ambulatory Visit: Payer: BLUE CROSS/BLUE SHIELD | Admitting: Family Medicine

## 2018-05-16 VITALS — BP 132/74 | HR 80 | Temp 99.5°F | Ht 67.0 in | Wt 160.0 lb

## 2018-05-16 DIAGNOSIS — Z0001 Encounter for general adult medical examination with abnormal findings: Secondary | ICD-10-CM | POA: Diagnosis not present

## 2018-05-16 DIAGNOSIS — Z0000 Encounter for general adult medical examination without abnormal findings: Secondary | ICD-10-CM

## 2018-05-16 DIAGNOSIS — Z114 Encounter for screening for human immunodeficiency virus [HIV]: Secondary | ICD-10-CM | POA: Diagnosis not present

## 2018-05-16 DIAGNOSIS — E785 Hyperlipidemia, unspecified: Secondary | ICD-10-CM

## 2018-05-16 DIAGNOSIS — J4520 Mild intermittent asthma, uncomplicated: Secondary | ICD-10-CM

## 2018-05-16 DIAGNOSIS — Z23 Encounter for immunization: Secondary | ICD-10-CM

## 2018-05-16 DIAGNOSIS — I10 Essential (primary) hypertension: Secondary | ICD-10-CM

## 2018-05-16 DIAGNOSIS — Z1211 Encounter for screening for malignant neoplasm of colon: Secondary | ICD-10-CM

## 2018-05-16 DIAGNOSIS — R809 Proteinuria, unspecified: Secondary | ICD-10-CM

## 2018-05-16 DIAGNOSIS — Z1212 Encounter for screening for malignant neoplasm of rectum: Secondary | ICD-10-CM

## 2018-05-16 DIAGNOSIS — K219 Gastro-esophageal reflux disease without esophagitis: Secondary | ICD-10-CM

## 2018-05-16 DIAGNOSIS — E119 Type 2 diabetes mellitus without complications: Secondary | ICD-10-CM

## 2018-05-16 LAB — BAYER DCA HB A1C WAIVED: HB A1C (BAYER DCA - WAIVED): 8.9 % — ABNORMAL HIGH (ref <7.0)

## 2018-05-16 MED ORDER — LISINOPRIL-HYDROCHLOROTHIAZIDE 20-12.5 MG PO TABS: 1.0000 | ORAL_TABLET | Freq: Every day | ORAL | 3 refills | Status: DC

## 2018-05-16 MED ORDER — SITAGLIPTIN PHOS-METFORMIN HCL 50-1000 MG PO TABS: 1.0000 | ORAL_TABLET | Freq: Two times a day (BID) | ORAL | 3 refills | Status: DC

## 2018-05-16 MED ORDER — FISH OIL 1000 MG PO CAPS: 2.0000 | ORAL_CAPSULE | Freq: Every day | ORAL | 3 refills | Status: AC

## 2018-05-16 MED ORDER — LORATADINE 10 MG PO TABS: 10.0000 mg | ORAL_TABLET | ORAL | 3 refills | Status: DC

## 2018-05-16 MED ORDER — FLUTICASONE-SALMETEROL 100-50 MCG/DOSE IN AEPB: 1.0000 | INHALATION_SPRAY | Freq: Two times a day (BID) | RESPIRATORY_TRACT | 3 refills | Status: DC

## 2018-05-16 MED ORDER — ASPIRIN EC 81 MG PO TBEC: 81.0000 mg | DELAYED_RELEASE_TABLET | Freq: Every day | ORAL | 3 refills | Status: AC

## 2018-05-16 MED ORDER — GLIPIZIDE 5 MG PO TABS: 5.0000 mg | ORAL_TABLET | Freq: Three times a day (TID) | ORAL | 3 refills | Status: DC

## 2018-05-16 MED ORDER — OMEPRAZOLE 20 MG PO CPDR: 20.0000 mg | DELAYED_RELEASE_CAPSULE | Freq: Two times a day (BID) | ORAL | 3 refills | Status: DC

## 2018-05-16 MED ORDER — ALBUTEROL SULFATE HFA 108 (90 BASE) MCG/ACT IN AERS: INHALATION_SPRAY | RESPIRATORY_TRACT | 0 refills | Status: DC

## 2018-05-16 MED ORDER — ATORVASTATIN CALCIUM 10 MG PO TABS: 5.0000 mg | ORAL_TABLET | Freq: Every day | ORAL | 3 refills | Status: DC

## 2018-05-16 NOTE — Patient Instructions (Signed)
DASH Eating Plan DASH stands for "Dietary Approaches to Stop Hypertension." The DASH eating plan is a healthy eating plan that has been shown to reduce high blood pressure (hypertension). It may also reduce your risk for type 2 diabetes, heart disease, and stroke. The DASH eating plan may also help with weight loss. What are tips for following this plan?  General guidelines  Avoid eating more than 2,300 mg (milligrams) of salt (sodium) a day. If you have hypertension, you may need to reduce your sodium intake to 1,500 mg a day.  Limit alcohol intake to no more than 1 drink a day for nonpregnant women and 2 drinks a day for men. One drink equals 12 oz of beer, 5 oz of wine, or 1 oz of hard liquor.  Work with your health care provider to maintain a healthy body weight or to lose weight. Ask what an ideal weight is for you.  Get at least 30 minutes of exercise that causes your heart to beat faster (aerobic exercise) most days of the week. Activities may include walking, swimming, or biking.  Work with your health care provider or diet and nutrition specialist (dietitian) to adjust your eating plan to your individual calorie needs. Reading food labels   Check food labels for the amount of sodium per serving. Choose foods with less than 5 percent of the Daily Value of sodium. Generally, foods with less than 300 mg of sodium per serving fit into this eating plan.  To find whole grains, look for the word "whole" as the first word in the ingredient list. Shopping  Buy products labeled as "low-sodium" or "no salt added."  Buy fresh foods. Avoid canned foods and premade or frozen meals. Cooking  Avoid adding salt when cooking. Use salt-free seasonings or herbs instead of table salt or sea salt. Check with your health care provider or pharmacist before using salt substitutes.  Do not fry foods. Cook foods using healthy methods such as baking, boiling, grilling, and broiling instead.  Cook with  heart-healthy oils, such as olive, canola, soybean, or sunflower oil. Meal planning  Eat a balanced diet that includes: ? 5 or more servings of fruits and vegetables each day. At each meal, try to fill half of your plate with fruits and vegetables. ? Up to 6-8 servings of whole grains each day. ? Less than 6 oz of lean meat, poultry, or fish each day. A 3-oz serving of meat is about the same size as a deck of cards. One egg equals 1 oz. ? 2 servings of low-fat dairy each day. ? A serving of nuts, seeds, or beans 5 times each week. ? Heart-healthy fats. Healthy fats called Omega-3 fatty acids are found in foods such as flaxseeds and coldwater fish, like sardines, salmon, and mackerel.  Limit how much you eat of the following: ? Canned or prepackaged foods. ? Food that is high in trans fat, such as fried foods. ? Food that is high in saturated fat, such as fatty meat. ? Sweets, desserts, sugary drinks, and other foods with added sugar. ? Full-fat dairy products.  Do not salt foods before eating.  Try to eat at least 2 vegetarian meals each week.  Eat more home-cooked food and less restaurant, buffet, and fast food.  When eating at a restaurant, ask that your food be prepared with less salt or no salt, if possible. What foods are recommended? The items listed may not be a complete list. Talk with your dietitian about   what dietary choices are best for you. Grains Whole-grain or whole-wheat bread. Whole-grain or whole-wheat pasta. Brown rice. Oatmeal. Quinoa. Bulgur. Whole-grain and low-sodium cereals. Pita bread. Low-fat, low-sodium crackers. Whole-wheat flour tortillas. Vegetables Fresh or frozen vegetables (raw, steamed, roasted, or grilled). Low-sodium or reduced-sodium tomato and vegetable juice. Low-sodium or reduced-sodium tomato sauce and tomato paste. Low-sodium or reduced-sodium canned vegetables. Fruits All fresh, dried, or frozen fruit. Canned fruit in natural juice (without  added sugar). Meat and other protein foods Skinless chicken or turkey. Ground chicken or turkey. Pork with fat trimmed off. Fish and seafood. Egg whites. Dried beans, peas, or lentils. Unsalted nuts, nut butters, and seeds. Unsalted canned beans. Lean cuts of beef with fat trimmed off. Low-sodium, lean deli meat. Dairy Low-fat (1%) or fat-free (skim) milk. Fat-free, low-fat, or reduced-fat cheeses. Nonfat, low-sodium ricotta or cottage cheese. Low-fat or nonfat yogurt. Low-fat, low-sodium cheese. Fats and oils Soft margarine without trans fats. Vegetable oil. Low-fat, reduced-fat, or light mayonnaise and salad dressings (reduced-sodium). Canola, safflower, olive, soybean, and sunflower oils. Avocado. Seasoning and other foods Herbs. Spices. Seasoning mixes without salt. Unsalted popcorn and pretzels. Fat-free sweets. What foods are not recommended? The items listed may not be a complete list. Talk with your dietitian about what dietary choices are best for you. Grains Baked goods made with fat, such as croissants, muffins, or some breads. Dry pasta or rice meal packs. Vegetables Creamed or fried vegetables. Vegetables in a cheese sauce. Regular canned vegetables (not low-sodium or reduced-sodium). Regular canned tomato sauce and paste (not low-sodium or reduced-sodium). Regular tomato and vegetable juice (not low-sodium or reduced-sodium). Pickles. Olives. Fruits Canned fruit in a light or heavy syrup. Fried fruit. Fruit in cream or butter sauce. Meat and other protein foods Fatty cuts of meat. Ribs. Fried meat. Bacon. Sausage. Bologna and other processed lunch meats. Salami. Fatback. Hotdogs. Bratwurst. Salted nuts and seeds. Canned beans with added salt. Canned or smoked fish. Whole eggs or egg yolks. Chicken or turkey with skin. Dairy Whole or 2% milk, cream, and half-and-half. Whole or full-fat cream cheese. Whole-fat or sweetened yogurt. Full-fat cheese. Nondairy creamers. Whipped toppings.  Processed cheese and cheese spreads. Fats and oils Butter. Stick margarine. Lard. Shortening. Ghee. Bacon fat. Tropical oils, such as coconut, palm kernel, or palm oil. Seasoning and other foods Salted popcorn and pretzels. Onion salt, garlic salt, seasoned salt, table salt, and sea salt. Worcestershire sauce. Tartar sauce. Barbecue sauce. Teriyaki sauce. Soy sauce, including reduced-sodium. Steak sauce. Canned and packaged gravies. Fish sauce. Oyster sauce. Cocktail sauce. Horseradish that you find on the shelf. Ketchup. Mustard. Meat flavorings and tenderizers. Bouillon cubes. Hot sauce and Tabasco sauce. Premade or packaged marinades. Premade or packaged taco seasonings. Relishes. Regular salad dressings. Where to find more information:  National Heart, Lung, and Blood Institute: www.nhlbi.nih.gov  American Heart Association: www.heart.org Summary  The DASH eating plan is a healthy eating plan that has been shown to reduce high blood pressure (hypertension). It may also reduce your risk for type 2 diabetes, heart disease, and stroke.  With the DASH eating plan, you should limit salt (sodium) intake to 2,300 mg a day. If you have hypertension, you may need to reduce your sodium intake to 1,500 mg a day.  When on the DASH eating plan, aim to eat more fresh fruits and vegetables, whole grains, lean proteins, low-fat dairy, and heart-healthy fats.  Work with your health care provider or diet and nutrition specialist (dietitian) to adjust your eating plan to your   individual calorie needs. This information is not intended to replace advice given to you by your health care provider. Make sure you discuss any questions you have with your health care provider. Document Released: 04/23/2011 Document Revised: 04/27/2016 Document Reviewed: 04/27/2016 Elsevier Interactive Patient Education  2019 [place]. Diabetes Basics  Diabetes (diabetes mellitus) is a long-term (chronic) disease. It occurs  when the body does not properly use sugar (glucose) that is released from food after you eat. Diabetes may be caused by one or both of these problems:  Your pancreas does not make enough of a hormone called insulin.  Your body does not react in a normal way to insulin that it makes. Insulin lets sugars (glucose) go into cells in your body. This gives you energy. If you have diabetes, sugars cannot get into cells. This causes high blood sugar (hyperglycemia). Follow these instructions at home: How is diabetes treated? You may need to take insulin or other diabetes medicines daily to keep your blood sugar in balance. Take your diabetes medicines every day as told by your doctor. List your diabetes medicines here: Diabetes medicines  Name of medicine: ______________________________ ? Amount (dose): _______________ Time (a.m./p.m.): _______________ Notes: ___________________________________  Name of medicine: ______________________________ ? Amount (dose): _______________ Time (a.m./p.m.): _______________ Notes: ___________________________________  Name of medicine: ______________________________ ? Amount (dose): _______________ Time (a.m./p.m.): _______________ Notes: ___________________________________ If you use insulin, you will learn how to give yourself insulin by injection. You may need to adjust the amount based on the food that you eat. List the types of insulin you use here: Insulin  Insulin type: ______________________________ ? Amount (dose): _______________ Time (a.m./p.m.): _______________ Notes: ___________________________________  Insulin type: ______________________________ ? Amount (dose): _______________ Time (a.m./p.m.): _______________ Notes: ___________________________________  Insulin type: ______________________________ ? Amount (dose): _______________ Time (a.m./p.m.): _______________ Notes: ___________________________________  Insulin type:  ______________________________ ? Amount (dose): _______________ Time (a.m./p.m.): _______________ Notes: ___________________________________  Insulin type: ______________________________ ? Amount (dose): _______________ Time (a.m./p.m.): _______________ Notes: ___________________________________ How do I manage my blood sugar?  Check your blood sugar levels using a blood glucose monitor as directed by your doctor. Your doctor will set treatment goals for you. Generally, you should have these blood sugar levels:  Before meals (preprandial): 80-130 mg/dL (4.4-7.2 mmol/L).  After meals (postprandial): below 180 mg/dL (10 mmol/L).  A1c level: less than 7%. Write down the times that you will check your blood sugar levels: Blood sugar checks  Time: _______________ Notes: ___________________________________  Time: _______________ Notes: ___________________________________  Time: _______________ Notes: ___________________________________  Time: _______________ Notes: ___________________________________  Time: _______________ Notes: ___________________________________  Time: _______________ Notes: ___________________________________  What do I need to know about low blood sugar? Low blood sugar is called hypoglycemia. This is when blood sugar is at or below 70 mg/dL (3.9 mmol/L). Symptoms may include:  Feeling: ? Hungry. ? Worried or nervous (anxious). ? Sweaty and clammy. ? Confused. ? Dizzy. ? Sleepy. ? Sick to your stomach (nauseous).  Having: ? A fast heartbeat. ? A headache. ? A change in your vision. ? Tingling or no feeling (numbness) around the mouth, lips, or tongue. ? Jerky movements that you cannot control (seizure).  Having trouble with: ? Moving (coordination). ? Sleeping. ? Passing out (fainting). ? Getting upset easily (irritability). Treating low blood sugar To treat low blood sugar, eat or drink something sugary right away. If you can think clearly and  swallow safely, follow the 15:15 rule:  Take 15 grams of a fast-acting carb (carbohydrate). Talk with your doctor about how much  you should take.  Some fast-acting carbs are: ? Sugar tablets (glucose pills). Take 3-4 glucose pills. ? 6-8 pieces of hard candy. ? 4-6 oz (120-150 mL) of fruit juice. ? 4-6 oz (120-150 mL) of regular (not diet) soda. ? 1 Tbsp (15 mL) honey or sugar.  Check your blood sugar 15 minutes after you take the carb.  If your blood sugar is still at or below 70 mg/dL (3.9 mmol/L), take 15 grams of a carb again.  If your blood sugar does not go above 70 mg/dL (3.9 mmol/L) after 3 tries, get help right away.  After your blood sugar goes back to normal, eat a meal or a snack within 1 hour. Treating very low blood sugar If your blood sugar is at or below 54 mg/dL (3 mmol/L), you have very low blood sugar (severe hypoglycemia). This is an emergency. Do not wait to see if the symptoms will go away. Get medical help right away. Call your local emergency services (911 in the [place] Do not drive yourself to the hospital. Questions to ask your health care provider  Do I need to meet with a diabetes educator?  What equipment will I need to care for myself at home?  What diabetes medicines do I need? When should I take them?  How often do I need to check my blood sugar?  What number can I call if I have questions?  When is my next doctor's visit?  Where can I find a support group for people with diabetes? Where to find more information  American Diabetes Association: www.diabetes.org  American Association of Diabetes Educators: www.diabeteseducator.org/patient-resources Contact a doctor if:  Your blood sugar is at or above 240 mg/dL (13.3 mmol/L) for 2 days in a row.  You have been sick or have had a fever for 2 days or more, and you are not getting better.  You have any of these problems for more than 6 hours: ? You cannot eat or drink. ? You feel sick to your  stomach (nauseous). ? You throw up (vomit). ? You have watery poop (diarrhea). Get help right away if:  Your blood sugar is lower than 54 mg/dL (3 mmol/L).  You get confused.  You have trouble: ? Thinking clearly. ? Breathing. Summary  Diabetes (diabetes mellitus) is a long-term (chronic) disease. It occurs when the body does not properly use sugar (glucose) that is released from food after digestion.  Take insulin and diabetes medicines as told.  Check your blood sugar every day, as often as told.  Keep all follow-up visits as told by your doctor. This is important. This information is not intended to replace advice given to you by your health care provider. Make sure you discuss any questions you have with your health care provider. Document Released: 08/06/2017 Document Revised: 10/25/2017 Document Reviewed: 08/06/2017 Elsevier Interactive Patient Education  2019 [place] Maintenance, Female Adopting a healthy lifestyle and getting preventive care can go a long way to promote health and wellness. Talk with your health care provider about what schedule of regular examinations is right for you. This is a good chance for you to check in with your provider about disease prevention and staying healthy. In between checkups, there are plenty of things you can do on your own. Experts have done a lot of research about which lifestyle changes and preventive measures are most likely to keep you healthy. Ask your health care provider for more information. Weight and diet Eat a healthy  diet  Be sure to include plenty of vegetables, fruits, low-fat dairy products, and lean protein.  Do not eat a lot of foods high in solid fats, added sugars, or salt.  Get regular exercise. This is one of the most important things you can do for your health. ? Most adults should exercise for at least 150 minutes each week. The exercise should increase your heart rate and make you sweat  (moderate-intensity exercise). ? Most adults should also do strengthening exercises at least twice a week. This is in addition to the moderate-intensity exercise. Maintain a healthy weight  Body mass index (BMI) is a measurement that can be used to identify possible weight problems. It estimates body fat based on height and weight. Your health care provider can help determine your BMI and help you achieve or maintain a healthy weight.  For females 37 years of age and older: ? A BMI below 18.5 is considered underweight. ? A BMI of 18.5 to 24.9 is normal. ? A BMI of 25 to 29.9 is considered overweight. ? A BMI of 30 and above is considered obese. Watch levels of cholesterol and blood lipids  You should start having your blood tested for lipids and cholesterol at 54 years of age, then have this test every 5 years.  You may need to have your cholesterol levels checked more often if: ? Your lipid or cholesterol levels are high. ? You are older than 54 years of age. ? You are at high risk for heart disease. Cancer screening Lung Cancer  Lung cancer screening is recommended for adults 43-51 years old who are at high risk for lung cancer because of a history of smoking.  A yearly low-dose CT scan of the lungs is recommended for people who: ? Currently smoke. ? Have quit within the past 15 years. ? Have at least a 30-pack-year history of smoking. A pack year is smoking an average of one pack of cigarettes a day for 1 year.  Yearly screening should continue until it has been 15 years since you quit.  Yearly screening should stop if you develop a health problem that would prevent you from having lung cancer treatment. Breast Cancer  Practice breast self-awareness. This means understanding how your breasts normally appear and feel.  It also means doing regular breast self-exams. Let your health care provider know about any changes, no matter how small.  If you are in your 20s or 30s, you  should have a clinical breast exam (CBE) by a health care provider every 1-3 years as part of a regular health exam.  If you are 58 or older, have a CBE every year. Also consider having a breast X-ray (mammogram) every year.  If you have a family history of breast cancer, talk to your health care provider about genetic screening.  If you are at high risk for breast cancer, talk to your health care provider about having an MRI and a mammogram every year.  Breast cancer gene (BRCA) assessment is recommended for women who have family members with BRCA-related cancers. BRCA-related cancers include: ? Breast. ? Ovarian. ? Tubal. ? Peritoneal cancers.  Results of the assessment will determine the need for genetic counseling and BRCA1 and BRCA2 testing. Cervical Cancer Your health care provider may recommend that you be screened regularly for cancer of the pelvic organs (ovaries, uterus, and vagina). This screening involves a pelvic examination, including checking for microscopic changes to the surface of your cervix (Pap test). You may  be encouraged to have this screening done every 3 years, beginning at age 47.  For women ages 80-65, health care providers may recommend pelvic exams and Pap testing every 3 years, or they may recommend the Pap and pelvic exam, combined with testing for human papilloma virus (HPV), every 5 years. Some types of HPV increase your risk of cervical cancer. Testing for HPV may also be done on women of any age with unclear Pap test results.  Other health care providers may not recommend any screening for nonpregnant women who are considered low risk for pelvic cancer and who do not have symptoms. Ask your health care provider if a screening pelvic exam is right for you.  If you have had past treatment for cervical cancer or a condition that could lead to cancer, you need Pap tests and screening for cancer for at least 20 years after your treatment. If Pap tests have been  discontinued, your risk factors (such as having a new sexual partner) need to be reassessed to determine if screening should resume. Some women have medical problems that increase the chance of getting cervical cancer. In these cases, your health care provider may recommend more frequent screening and Pap tests. Colorectal Cancer  This type of cancer can be detected and often prevented.  Routine colorectal cancer screening usually begins at 54 years of age and continues through 54 years of age.  Your health care provider may recommend screening at an earlier age if you have risk factors for colon cancer.  Your health care provider may also recommend using home test kits to check for hidden blood in the stool.  A small camera at the end of a tube can be used to examine your colon directly (sigmoidoscopy or colonoscopy). This is done to check for the earliest forms of colorectal cancer.  Routine screening usually begins at age 36.  Direct examination of the colon should be repeated every 5-10 years through 54 years of age. However, you may need to be screened more often if early forms of precancerous polyps or small growths are found. Skin Cancer  Check your skin from head to toe regularly.  Tell your health care provider about any new moles or changes in moles, especially if there is a change in a mole's shape or color.  Also tell your health care provider if you have a mole that is larger than the size of a pencil eraser.  Always use sunscreen. Apply sunscreen liberally and repeatedly throughout the day.  Protect yourself by wearing long sleeves, pants, a wide-brimmed hat, and sunglasses whenever you are outside. Heart disease, diabetes, and high blood pressure  High blood pressure causes heart disease and increases the risk of stroke. High blood pressure is more likely to develop in: ? People who have blood pressure in the high end of the normal range (130-139/85-89 mm Hg). ? People  who are overweight or obese. ? People who are African American.  If you are 36-40 years of age, have your blood pressure checked every 3-5 years. If you are 24 years of age or older, have your blood pressure checked every year. You should have your blood pressure measured twice-once when you are at a hospital or clinic, and once when you are not at a hospital or clinic. Record the average of the two measurements. To check your blood pressure when you are not at a hospital or clinic, you can use: ? An automated blood pressure machine at a pharmacy. ?  A home blood pressure monitor.  If you are between 76 years and 41 years old, ask your health care provider if you should take aspirin to prevent strokes.  Have regular diabetes screenings. This involves taking a blood sample to check your fasting blood sugar level. ? If you are at a normal weight and have a low risk for diabetes, have this test once every three years after 54 years of age. ? If you are overweight and have a high risk for diabetes, consider being tested at a younger age or more often. Preventing infection Hepatitis B  If you have a higher risk for hepatitis B, you should be screened for this virus. You are considered at high risk for hepatitis B if: ? You were born in a country where hepatitis B is common. Ask your health care provider which countries are considered high risk. ? Your parents were born in a high-risk country, and you have not been immunized against hepatitis B (hepatitis B vaccine). ? You have HIV or AIDS. ? You use needles to inject street drugs. ? You live with someone who has hepatitis B. ? You have had sex with someone who has hepatitis B. ? You get hemodialysis treatment. ? You take certain medicines for conditions, including cancer, organ transplantation, and autoimmune conditions. Hepatitis C  Blood testing is recommended for: ? Everyone born from [birth year] through 1967. ? Anyone with known risk factors for  hepatitis C. Sexually transmitted infections (STIs)  You should be screened for sexually transmitted infections (STIs) including gonorrhea and chlamydia if: ? You are sexually active and are younger than 54 years of age. ? You are older than 54 years of age and your health care provider tells you that you are at risk for this type of infection. ? Your sexual activity has changed since you were last screened and you are at an increased risk for chlamydia or gonorrhea. Ask your health care provider if you are at risk.  If you do not have HIV, but are at risk, it may be recommended that you take a prescription medicine daily to prevent HIV infection. This is called pre-exposure prophylaxis (PrEP). You are considered at risk if: ? You are sexually active and do not regularly use condoms or know the HIV status of your partner(s). ? You take drugs by injection. ? You are sexually active with a partner who has HIV. Talk with your health care provider about whether you are at high risk of being infected with HIV. If you choose to begin PrEP, you should first be tested for HIV. You should then be tested every 3 months for as long as you are taking PrEP. Pregnancy  If you are premenopausal and you may become pregnant, ask your health care provider about preconception counseling.  If you may become pregnant, take 400 to 800 micrograms (mcg) of folic acid every day.  If you want to prevent pregnancy, talk to your health care provider about birth control (contraception). Osteoporosis and menopause  Osteoporosis is a disease in which the bones lose minerals and strength with aging. This can result in serious bone fractures. Your risk for osteoporosis can be identified using a bone density scan.  If you are 62 years of age or older, or if you are at risk for osteoporosis and fractures, ask your health care provider if you should be screened.  Ask your health care provider whether you should take a calcium  or vitamin D supplement to lower your  risk for osteoporosis.  Menopause may have certain physical symptoms and risks.  Hormone replacement therapy may reduce some of these symptoms and risks. Talk to your health care provider about whether hormone replacement therapy is right for you. Follow these instructions at home:  Schedule regular health, dental, and eye exams.  Stay current with your immunizations.  Do not use any tobacco products including cigarettes, chewing tobacco, or electronic cigarettes.  If you are pregnant, do not drink alcohol.  If you are breastfeeding, limit how much and how often you drink alcohol.  Limit alcohol intake to no more than 1 drink per day for nonpregnant women. One drink equals 12 ounces of beer, 5 ounces of wine, or 1 ounces of hard liquor.  Do not use street drugs.  Do not share needles.  Ask your health care provider for help if you need support or information about quitting drugs.  Tell your health care provider if you often feel depressed.  Tell your health care provider if you have ever been abused or do not feel safe at home. This information is not intended to replace advice given to you by your health care provider. Make sure you discuss any questions you have with your health care provider. Document Released: 11/17/2010 Document Revised: 10/10/2015 Document Reviewed: 02/05/2015 Elsevier Interactive Patient Education  2019 [employer].

## 2018-05-16 NOTE — Progress Notes (Addendum)
Subjective:    Patient ID: Brittany Jenkins, female    DOB: 20-Jun-1965, 52 y.o.   MRN: [id number]  Chief Complaint:  Medical Management of Chronic Issues (out of Janumet x 4-5 days, needs all meds refilled; patient has not been seen for cihronic follow up since Dr. Wendi Snipes left)   HPI: Brittany Jenkins is a 52 y.o. female presenting on 05/16/2018 for Medical Management of Chronic Issues (out of Janumet x 4-5 days, needs all meds refilled; patient has not been seen for cihronic follow up since Dr. Wendi Snipes left)   1. Annual physical exam  Pt presents today for her annual physical exam. States she has not seen a PCP since February. States she gets her PAPs and mammogram done by her GYN, her last PAP was in 2018 and normal. Her last mammogram was done in 02/2018 and was normal. States she does the stool sample for colorectal cancer screening. She states she rans out of her diabetic medications and her blood sugars were running high until she restarted the medications about 3 days ago. She states other than her blood sugar being elevated she is doing fairly well. She does not exercise regularly or watch her diet.    2. Type 2 diabetes mellitus without complication, without long-term current use of insulin ([place])  Compliant with meds - She recently ran out of medications and was out of them for at least 2 weeks causing her blood sugar to be elevated. She reports she did get them refilled and has been complaint over the last few days.  Checking CBGs? Yes  Fasting avg - 150  Postprandial average - 180 Exercising regularly? - No Watching carbohydrate intake? - No Neuropathy ? - No Hypoglycemic events - No Pertinent ROS:  Polyuria - No Polydipsia - No Vision problems - No    3. Hyperlipidemia, unspecified hyperlipidemia type  Does not watch diet or exercise on a regular basis. Is compliant with medications and is tolerating them well.    4. Essential hypertension  Complaint with meds -  Yes Checking BP at home - No Exercising Regularly - No Watching Salt intake - No Pertinent ROS:  Headache - No Chest pain - No Dyspnea - No Palpitations - Yes LE edema - No They report good compliance with medications and can restate their regimen by memory. No medication side effects.  BP Readings from Last 3 Encounters:  05/16/18 132/74  10/04/17 138/81  07/13/17 140/80      Relevant past medical, surgical, family, and social history reviewed and updated as indicated.  Allergies and medications reviewed and updated.   Past Medical History:  Diagnosis Date  . Asthma   . Diabetes mellitus without complication ([place])   . GERD (gastroesophageal reflux disease)   . Hyperlipidemia   . Hypertension   . Microalbuminuria     Past Surgical History:  Procedure Laterality Date  . HERNIA REPAIR      Social History   Socioeconomic History  . Marital status: Married    Spouse name: Not on file  . Number of children: Not on file  . Years of education: Not on file  . Highest education level: Not on file  Occupational History  . Not on file  Social Needs  . Financial resource strain: Not on file  . Food insecurity:    Worry: Not on file    Inability: Not on file  . Transportation needs:    Medical: Not on file    Non-medical:  Not on file  Tobacco Use  . Smoking status: Former Smoker    Types: Cigarettes    Last attempt to quit: 05/18/1998    Years since quitting: 20.0  . Smokeless tobacco: Never Used  Substance and Sexual Activity  . Alcohol use: Not Currently  . Drug use: Never  . Sexual activity: Not on file  Lifestyle  . Physical activity:    Days per week: Not on file    Minutes per session: Not on file  . Stress: Not on file  Relationships  . Social connections:    Talks on phone: Not on file    Gets together: Not on file    Attends religious service: Not on file    Active member of club or organization: Not on file    Attends meetings of clubs or  organizations: Not on file    Relationship status: Not on file  . Intimate partner violence:    Fear of current or ex partner: Not on file    Emotionally abused: Not on file    Physically abused: Not on file    Forced sexual activity: Not on file  Other Topics Concern  . Not on file  Social History Narrative  . Not on file    Outpatient Encounter Medications as of 05/16/2018  Medication Sig  . albuterol (VENTOLIN HFA) 108 (90 Base) MCG/ACT inhaler INHALE 2 PUFFS BY MOUTH EVERY 4 TO 6 HOURS AS A RESCUE FOR ASTHMA  . aspirin EC 81 MG tablet Take 1 tablet (81 mg total) by mouth daily.  Marland Kitchen atorvastatin (LIPITOR) 10 MG tablet Take 0.5 tablets (5 mg total) by mouth daily.  . clotrimazole (MYCELEX) 10 MG troche DISSOLVE 1 TROCHE (10 MG TOTAL) IN MOUTH 5 (FIVE) TIMES DAILY.  . diphenhydrAMINE (BENADRYL) 25 mg capsule Take 25 mg by mouth every other day.  . Fluticasone-Salmeterol (ADVAIR) 100-50 MCG/DOSE AEPB Inhale 1 puff into the lungs 2 (two) times daily.  Marland Kitchen glipiZIDE (GLUCOTROL) 5 MG tablet Take 1 tablet (5 mg total) by mouth 3 (three) times daily. Needs OV  . lisinopril-hydrochlorothiazide (PRINZIDE,ZESTORETIC) 20-12.5 MG tablet Take 1 tablet by mouth daily. (Needs to be seen before next refill)  . loratadine (CLARITIN) 10 MG tablet Take 1 tablet (10 mg total) by mouth every other day.  . Omega-3 Fatty Acids (FISH OIL) 1000 MG CAPS Take 2 capsules (2,000 mg total) by mouth daily.  Marland Kitchen omeprazole (PRILOSEC) 20 MG capsule Take 1 capsule (20 mg total) by mouth 2 (two) times daily before a meal.  . sitaGLIPtin-metformin (JANUMET) 50-1000 MG tablet Take 1 tablet by mouth 2 (two) times daily.  . traMADol (ULTRAM) 50 MG tablet Take 1 tablet (50 mg total) by mouth every 6 (six) hours as needed.  . triamcinolone ointment (KENALOG) 0.5 % APPLY TO AFFECTED AREA TWICE A DAY  . [DISCONTINUED] albuterol (VENTOLIN HFA) 108 (90 Base) MCG/ACT inhaler INHALE 2 PUFFS BY MOUTH EVERY 4 TO 6 HOURS AS A RESCUE FOR  ASTHMA  . [DISCONTINUED] aspirin EC 81 MG tablet Take 81 mg by mouth daily.  . [DISCONTINUED] atorvastatin (LIPITOR) 20 MG tablet Take 1 tablet (20 mg total) by mouth daily. (Patient taking differently: Take 5 mg by mouth daily. )  . [DISCONTINUED] Fluticasone-Salmeterol (ADVAIR) 100-50 MCG/DOSE AEPB Inhale 1 puff into the lungs 2 (two) times daily.  . [DISCONTINUED] glipiZIDE (GLUCOTROL) 5 MG tablet Take 1 tablet (5 mg total) by mouth 3 (three) times daily. Needs OV  . [DISCONTINUED] lisinopril-hydrochlorothiazide ([place])  20-12.5 MG tablet Take 1 tablet by mouth daily. (Needs to be seen before next refill)  . [DISCONTINUED] loratadine (CLARITIN) 10 MG tablet Take 10 mg by mouth every other day.  . [DISCONTINUED] Omega-3 Fatty Acids (FISH OIL) 1000 MG CAPS Take 2 capsules by mouth daily.  . [DISCONTINUED] omeprazole (PRILOSEC) 20 MG capsule Take 20 mg by mouth 2 (two) times daily before a meal.  . [DISCONTINUED] sitaGLIPtin-metformin (JANUMET) 50-1000 MG tablet Take 1 tablet by mouth 2 (two) times daily.  . [DISCONTINUED] fluticasone furoate-vilanterol (BREO ELLIPTA) 100-25 MCG/INH AEPB INHALE 1 PUFF INTO THE LUNGS DAILY.  . [DISCONTINUED] Fluticasone-Umeclidin-Vilant (TRELEGY ELLIPTA) 100-62.5-25 MCG/INH AEPB Inhale 1 puff into the lungs daily.   No facility-administered encounter medications on file as of 05/16/2018.     Allergies  Allergen Reactions  . Erythromycin   . Victoza [Liraglutide] Diarrhea and Nausea Only    Review of Systems  Constitutional: Positive for fatigue. Negative for activity change, chills, fever and unexpected weight change.  Eyes: Negative for photophobia and visual disturbance.  Respiratory: Negative for cough, chest tightness and shortness of breath.   Cardiovascular: Positive for palpitations (intermittent, only at night). Negative for chest pain and leg swelling.  Gastrointestinal: Negative for abdominal distention, abdominal pain, anal bleeding,  blood in stool, constipation, diarrhea, nausea, rectal pain and vomiting.  Endocrine: Negative for polydipsia, polyphagia and polyuria.  Genitourinary: Negative for difficulty urinating.  Musculoskeletal: Negative for arthralgias, back pain, gait problem, myalgias, neck pain and neck stiffness.  Skin: Negative.   Neurological: Negative for dizziness, tremors, weakness, light-headedness, numbness and headaches.  Psychiatric/Behavioral: Negative for behavioral problems, confusion and sleep disturbance.  All other systems reviewed and are negative.       Objective:    BP 132/74   Pulse 80   Temp 99.5 F (37.5 C) (Oral)   Ht 5' 7"  (1.702 m)   Wt 160 lb (72.6 kg)   LMP 09/07/2015   BMI 25.06 kg/m    Wt Readings from Last 3 Encounters:  05/16/18 160 lb (72.6 kg)  10/04/17 161 lb 3.2 oz (73.1 kg)  07/13/17 162 lb 3.2 oz (73.6 kg)    Physical Exam Vitals signs and nursing note reviewed.  Constitutional:      General: She is not in acute distress.    Appearance: Normal appearance. She is well-developed, well-groomed and overweight. She is not ill-appearing.  HENT:     Head: Normocephalic and atraumatic.     Jaw: There is normal jaw occlusion.     Right Ear: Hearing, tympanic membrane, ear canal and external ear normal.     Left Ear: Hearing, tympanic membrane, ear canal and external ear normal.     Nose: Nose normal.     Mouth/Throat:     Lips: Pink.     Mouth: Mucous membranes are moist.     Pharynx: Oropharynx is clear. Uvula midline.  Eyes:     General: Lids are normal.     Extraocular Movements: Extraocular movements intact.     Conjunctiva/sclera: Conjunctivae normal.     Pupils: Pupils are equal, round, and reactive to light.     Visual Fields: Right eye visual fields normal and left eye visual fields normal.  Neck:     Musculoskeletal: Full passive range of motion without pain and neck supple.     Thyroid: No thyroid mass, thyromegaly or thyroid tenderness.      Vascular: No carotid bruit or JVD.     Trachea: Trachea and phonation  normal.  Cardiovascular:     Rate and Rhythm: Normal rate and regular rhythm.     Pulses: Normal pulses.     Heart sounds: Normal heart sounds. No murmur. No friction rub. No gallop.   Pulmonary:     Effort: Pulmonary effort is normal. No respiratory distress.     Breath sounds: Normal breath sounds.  Abdominal:     General: Abdomen is flat. Bowel sounds are normal.     Palpations: Abdomen is soft.     Tenderness: There is no abdominal tenderness. There is no right CVA tenderness or left CVA tenderness.     Hernia: No hernia is present.  Musculoskeletal: Normal range of motion.     Right lower leg: No edema.     Left lower leg: No edema.  Lymphadenopathy:     Cervical: No cervical adenopathy.     Right cervical: No superficial cervical adenopathy.    Left cervical: No superficial cervical adenopathy.  Skin:    General: Skin is warm and dry.     Capillary Refill: Capillary refill takes less than 2 seconds.  Neurological:     General: No focal deficit present.     Mental Status: She is alert and oriented to person, place, and time.     Cranial Nerves: Cranial nerves are intact.     Sensory: Sensation is intact.     Motor: Motor function is intact.     Coordination: Coordination is intact.     Gait: Gait is intact.     Deep Tendon Reflexes: Reflexes are normal and symmetric.  Psychiatric:        Attention and Perception: Attention and perception normal.        Mood and Affect: Mood normal.        Speech: Speech normal.        Behavior: Behavior normal. Behavior is cooperative.        Thought Content: Thought content normal.        Cognition and Memory: Cognition and memory normal.        Judgment: Judgment normal.     Results for orders placed or performed in visit on 04/06/18  HM DIABETES EYE EXAM  Result Value Ref Range   HM Diabetic Eye Exam No Retinopathy No Retinopathy       Pertinent labs &  imaging results that were available during my care of the patient were reviewed by me and considered in my medical decision making.  Assessment & Plan:  Jailin was seen today for medical management of chronic issues.  Diagnoses and all orders for this visit:  Annual physical exam -     CBC with Differential/Platelet -     Lipid panel -     Thyroid Panel With TSH -     CMP14+EGFR -     HIV Antibody (routine testing w rflx) -     Tdap vaccine greater than or equal to 7yo IM -     Pneumococcal polysaccharide vaccine 23-valent greater than or equal to 2yo subcutaneous/IM -     Cologuard -     Hepatitis C antibody  Type 2 diabetes mellitus without complication, without long-term current use of insulin ([facility]) A1C 8.9 today. Has been off of medications, will recheck in 3 months and adjust medications then if still elevated.  -     Bayer DCA Hb A1c Waived -     Microalbumin / creatinine urine ratio -     CBC with Differential/Platelet -  Lipid panel -     Thyroid Panel With TSH -     CMP14+EGFR -     glipiZIDE (GLUCOTROL) 5 MG tablet; Take 1 tablet (5 mg total) by mouth 3 (three) times daily. Needs OV -     sitaGLIPtin-metformin (JANUMET) 50-1000 MG tablet; Take 1 tablet by mouth 2 (two) times daily.  Hyperlipidemia, unspecified hyperlipidemia type -     CBC with Differential/Platelet -     Lipid panel -     Thyroid Panel With TSH -     CMP14+EGFR -     atorvastatin (LIPITOR) 10 MG tablet; Take 0.5 tablets (5 mg total) by mouth daily. -     aspirin EC 81 MG tablet; Take 1 tablet (81 mg total) by mouth daily. -     Omega-3 Fatty Acids (FISH OIL) 1000 MG CAPS; Take 2 capsules (2,000 mg total) by mouth daily.  Essential hypertension -     Microalbumin / creatinine urine ratio -     CBC with Differential/Platelet -     Lipid panel -     Thyroid Panel With TSH -     CMP14+EGFR -     lisinopril-hydrochlorothiazide (PRINZIDE,ZESTORETIC) 20-12.5 MG tablet; Take 1 tablet by mouth  daily. (Needs to be seen before next refill) -     aspirin EC 81 MG tablet; Take 1 tablet (81 mg total) by mouth daily.  Screening for HIV (human immunodeficiency virus) -     HIV Antibody (routine testing w rflx)  Mild intermittent asthma without complication -     Fluticasone-Salmeterol (ADVAIR) 100-50 MCG/DOSE AEPB; Inhale 1 puff into the lungs 2 (two) times daily. -     albuterol (VENTOLIN HFA) 108 (90 Base) MCG/ACT inhaler; INHALE 2 PUFFS BY MOUTH EVERY 4 TO 6 HOURS AS A RESCUE FOR ASTHMA -     loratadine (CLARITIN) 10 MG tablet; Take 1 tablet (10 mg total) by mouth every other day.  Microalbuminuria -     Microalbumin / creatinine urine ratio  Gastroesophageal reflux disease without esophagitis -     omeprazole (PRILOSEC) 20 MG capsule; Take 1 capsule (20 mg total) by mouth 2 (two) times daily before a meal.  Encounter for colorectal cancer screening -     Cologuard  Diet and exercise encouraged.  Health Maintenance discussed.  Labs pending. Will recheck A1C in three months, if still elevated will adjust medications then since she has been without medications for a few weeks.   Continue all other maintenance medications.  Follow up plan: Return in about 3 months (around 08/15/2018).  Educational handout given for health maintenance, DASH diet, diabetes.  The above assessment and management plan was discussed with the patient. The patient verbalized understanding of and has agreed to the management plan. Patient is aware to call the clinic if symptoms persist or worsen. Patient is aware when to return to the clinic for a follow-up visit. Patient educated on when it is appropriate to go to the emergency department.   Monia Pouch, FNP-C [facility] [phone]

## 2018-05-17 ENCOUNTER — Encounter: Payer: Self-pay | Admitting: Pediatrics

## 2018-05-17 LAB — CMP14+EGFR
ALK PHOS: 64 IU/L (ref 39–117)
ALT: 34 IU/L — ABNORMAL HIGH (ref 0–32)
AST: 21 IU/L (ref 0–40)
Albumin/Globulin Ratio: 1.8 (ref 1.2–2.2)
Albumin: 4.4 g/dL (ref 3.5–5.5)
BUN/Creatinine Ratio: 18 (ref 9–23)
BUN: 13 mg/dL (ref 6–24)
Bilirubin Total: 0.3 mg/dL (ref 0.0–1.2)
CO2: 21 mmol/L (ref 20–29)
Calcium: 9.9 mg/dL (ref 8.7–10.2)
Chloride: 101 mmol/L (ref 96–106)
Creatinine, Ser: 0.72 mg/dL (ref 0.57–1.00)
GFR calc Af Amer: 110 mL/min/{1.73_m2} (ref >59)
GFR calc non Af Amer: 95 mL/min/{1.73_m2} (ref >59)
Globulin, Total: 2.4 g/dL (ref 1.5–4.5)
Glucose: 200 mg/dL — ABNORMAL HIGH (ref 65–99)
Potassium: 4.2 mmol/L (ref 3.5–5.2)
Sodium: 142 mmol/L (ref 134–144)
Total Protein: 6.8 g/dL (ref 6.0–8.5)

## 2018-05-17 LAB — LIPID PANEL
Chol/HDL Ratio: 4.5 ratio — ABNORMAL HIGH (ref 0.0–4.4)
Cholesterol, Total: 177 mg/dL (ref 100–199)
HDL: 39 mg/dL — ABNORMAL LOW (ref >39)
LDL Calculated: 94 mg/dL (ref 0–99)
Triglycerides: 220 mg/dL — ABNORMAL HIGH (ref 0–149)
VLDL Cholesterol Cal: 44 mg/dL — ABNORMAL HIGH (ref 5–40)

## 2018-05-17 LAB — CBC WITH DIFFERENTIAL/PLATELET
Basophils Absolute: 0.1 10*3/uL (ref 0.0–0.2)
Basos: 1 %
EOS (ABSOLUTE): 0.3 10*3/uL (ref 0.0–0.4)
Eos: 4 %
Hematocrit: 41.7 % (ref 34.0–46.6)
Hemoglobin: 13.9 g/dL (ref 11.1–15.9)
Immature Grans (Abs): 0.1 10*3/uL (ref 0.0–0.1)
Immature Granulocytes: 1 %
Lymphocytes Absolute: 1.7 10*3/uL (ref 0.7–3.1)
Lymphs: 21 %
MCH: 29.8 pg (ref 26.6–33.0)
MCHC: 33.3 g/dL (ref 31.5–35.7)
MCV: 90 fL (ref 79–97)
Monocytes Absolute: 0.6 10*3/uL (ref 0.1–0.9)
Monocytes: 7 %
NEUTROS ABS: 5.4 10*3/uL (ref 1.4–7.0)
Neutrophils: 66 %
Platelets: 379 10*3/uL (ref 150–450)
RBC: 4.66 x10E6/uL (ref 3.77–5.28)
RDW: 13.3 % (ref 12.3–15.4)
WBC: 8.1 10*3/uL (ref 3.4–10.8)

## 2018-05-17 LAB — THYROID PANEL WITH TSH
FREE THYROXINE INDEX: 1.9 (ref 1.2–4.9)
T3 UPTAKE RATIO: 25 % (ref 24–39)
T4, Total: 7.7 ug/dL (ref 4.5–12.0)
TSH: 1.3 u[IU]/mL (ref 0.450–4.500)

## 2018-05-17 LAB — HIV ANTIBODY (ROUTINE TESTING W REFLEX): HIV Screen 4th Generation wRfx: NONREACTIVE

## 2018-05-17 LAB — MICROALBUMIN / CREATININE URINE RATIO
CREATININE, UR: 18.9 mg/dL
Microalb/Creat Ratio: <15.9 mg/g creat (ref 0.0–30.0)
Microalbumin, Urine: <3 ug/mL

## 2018-05-17 LAB — HEPATITIS C ANTIBODY: Hep C Virus Ab: <0.1 s/co ratio (ref 0.0–0.9)

## 2018-05-24 ENCOUNTER — Telehealth: Payer: Self-pay | Admitting: Pediatrics

## 2018-05-24 NOTE — Telephone Encounter (Signed)
Pt called just wanting to make sure her urine microalbmumin was normal. Advised it was fine.

## 2018-07-22 ENCOUNTER — Other Ambulatory Visit: Payer: Self-pay | Admitting: *Deleted

## 2018-07-25 ENCOUNTER — Ambulatory Visit: Payer: BLUE CROSS/BLUE SHIELD | Admitting: Nurse Practitioner

## 2018-07-25 ENCOUNTER — Encounter: Payer: Self-pay | Admitting: Nurse Practitioner

## 2018-07-25 VITALS — BP 134/88 | HR 101 | Temp 97.6°F | Ht 67.0 in | Wt 160.0 lb

## 2018-07-25 DIAGNOSIS — K625 Hemorrhage of anus and rectum: Secondary | ICD-10-CM | POA: Diagnosis not present

## 2018-07-25 NOTE — Patient Instructions (Signed)
Rectal Bleeding    Rectal bleeding is when blood comes out of the opening of the butt (anus). People with this kind of bleeding may notice bright red blood in their underwear or in the toilet after they poop (have a bowel movement). They may also have dark red or black poop (stool). Rectal bleeding is often a sign that something is wrong. It needs to be checked by a doctor.  Follow these instructions at home:  Watch for any changes in your condition. Take these actions to help with bleeding and discomfort:  Eat a diet that is high in fiber. This will keep your poop soft so it is easier for you to poop without pushing too hard. Ask your doctor to tell you what foods and drinks are high in fiber.  Drink enough fluid to keep your pee (urine) clear or pale yellow. This also helps keep your poop soft.  Try taking a warm bath. This may help with pain.  Keep all follow-up visits as told by your doctor. This is important.  Get help right away if:  You have new bleeding.  You have more bleeding than before.  You have black or dark red poop.  You throw up (vomit) blood or something that looks like coffee grounds.  You have pain or tenderness in your belly (abdomen).  You have a fever.  You feel weak.  You feel sick to your stomach (nauseous).  You pass out (faint).  You have very bad pain in your butt.  You cannot poop.  This information is not intended to replace advice given to you by your health care provider. Make sure you discuss any questions you have with your health care provider.  Document Released: 01/14/2011 Document Revised: 10/10/2015 Document Reviewed: 06/30/2015  Elsevier Interactive Patient Education  2019 [employer]

## 2018-07-25 NOTE — Progress Notes (Signed)
   Subjective:    Patient ID: Brittany Jenkins, female    DOB: Sep 20, 1965, 53 y.o.   MRN: [id number]   Chief Complaint: hemorrhoids  HPI Patient comes in today c/o bright red blood in her BM for the last 2 days. She said this morning she has been wiping blood after bowel movement. She denies any straining when she goes to the bathroom. No constipation. She has had hemorrhoid in the past.   Review of Systems  Constitutional: Negative.   Respiratory: Negative.   Cardiovascular: Negative.   Gastrointestinal: Negative for constipation and diarrhea.  Neurological: Negative.   Psychiatric/Behavioral: Negative.   All other systems reviewed and are negative.      Objective:   Physical Exam Vitals signs and nursing note reviewed.  Constitutional:      Appearance: She is obese.  Cardiovascular:     Rate and Rhythm: Normal rate and regular rhythm.     Heart sounds: Normal heart sounds.  Pulmonary:     Effort: Pulmonary effort is normal.     Breath sounds: Normal breath sounds.  Genitourinary:    Comments: No rectal masses palpable Dark stool- hemacult positive Skin:    General: Skin is warm and dry.  Neurological:     General: No focal deficit present.     Mental Status: She is alert and oriented to person, place, and time.  Psychiatric:        Mood and Affect: Mood normal.        Behavior: Behavior normal.    BP 134/88   Pulse (!) 101   Temp 97.6 F (36.4 C) (Oral)   Ht 5\' 7"  (1.702 m)   Wt 160 lb (72.6 kg)   LMP 09/07/2015   BMI 25.06 kg/m         Assessment & Plan:  Brittany Jenkins in today with chief complaint of Blood In Stools (Started last Wed)   1. Rectal bleeding Continue to monitor - Ambulatory referral to Gastroenterology  Mary-Margaret Daphine Deutscher, FNP

## 2018-07-26 ENCOUNTER — Other Ambulatory Visit: Payer: Self-pay | Admitting: Family Medicine

## 2018-07-26 ENCOUNTER — Other Ambulatory Visit: Payer: Self-pay | Admitting: *Deleted

## 2018-07-26 DIAGNOSIS — J4520 Mild intermittent asthma, uncomplicated: Secondary | ICD-10-CM

## 2018-07-26 MED ORDER — CLOTRIMAZOLE 10 MG MT TROC: OROMUCOSAL | 0 refills | Status: DC

## 2018-07-28 ENCOUNTER — Telehealth: Payer: Self-pay | Admitting: Family Medicine

## 2018-07-28 NOTE — Telephone Encounter (Signed)
We will try to get referral done quickly

## 2018-07-28 NOTE — Telephone Encounter (Signed)
Referral changed to urgent and GI office contacted

## 2018-07-29 ENCOUNTER — Encounter: Payer: Self-pay | Admitting: Gastroenterology

## 2018-08-01 ENCOUNTER — Encounter: Payer: Self-pay | Admitting: Gastroenterology

## 2018-08-05 ENCOUNTER — Telehealth: Payer: Self-pay

## 2018-08-05 NOTE — Telephone Encounter (Signed)
Covid-19 travel screening questions  Have you traveled in the last 14 days?   If yes where?  Do you now or have you had a fever in the last 14 days?  Do you have any respiratory symptoms of shortness of breath or cough now or in the last 14 days?  Do you have a medical history of Congestive Heart Failure?  Do you have a medical history of lung disease?  Do you have any family members or close contacts with diagnosed or suspected Covid-19?   Left a message requesting a call back with any "YES" answers.

## 2018-08-08 ENCOUNTER — Ambulatory Visit: Payer: BLUE CROSS/BLUE SHIELD | Admitting: Gastroenterology

## 2018-08-14 ENCOUNTER — Other Ambulatory Visit: Payer: Self-pay | Admitting: Family Medicine

## 2018-08-14 DIAGNOSIS — I10 Essential (primary) hypertension: Secondary | ICD-10-CM

## 2018-08-16 ENCOUNTER — Ambulatory Visit: Payer: BLUE CROSS/BLUE SHIELD | Admitting: Family Medicine

## 2018-08-26 ENCOUNTER — Other Ambulatory Visit: Payer: Self-pay | Admitting: Family Medicine

## 2018-08-26 DIAGNOSIS — K219 Gastro-esophageal reflux disease without esophagitis: Secondary | ICD-10-CM

## 2018-09-02 DIAGNOSIS — Z7183 Encounter for nonprocreative genetic counseling: Secondary | ICD-10-CM | POA: Diagnosis not present

## 2018-09-02 DIAGNOSIS — Z803 Family history of malignant neoplasm of breast: Secondary | ICD-10-CM | POA: Diagnosis not present

## 2018-09-02 DIAGNOSIS — Z800 Family history of malignant neoplasm of digestive organs: Secondary | ICD-10-CM | POA: Diagnosis not present

## 2018-09-02 DIAGNOSIS — Z8041 Family history of malignant neoplasm of ovary: Secondary | ICD-10-CM | POA: Diagnosis not present

## 2018-09-10 ENCOUNTER — Other Ambulatory Visit: Payer: Self-pay | Admitting: Family Medicine

## 2018-09-10 DIAGNOSIS — E785 Hyperlipidemia, unspecified: Secondary | ICD-10-CM

## 2018-09-13 ENCOUNTER — Other Ambulatory Visit: Payer: Self-pay | Admitting: Family Medicine

## 2018-09-13 DIAGNOSIS — E119 Type 2 diabetes mellitus without complications: Secondary | ICD-10-CM

## 2018-09-20 ENCOUNTER — Other Ambulatory Visit: Payer: Self-pay | Admitting: Family Medicine

## 2018-09-27 ENCOUNTER — Other Ambulatory Visit: Payer: Self-pay | Admitting: Family Medicine

## 2018-09-27 ENCOUNTER — Other Ambulatory Visit: Payer: Self-pay | Admitting: *Deleted

## 2018-09-27 DIAGNOSIS — E119 Type 2 diabetes mellitus without complications: Secondary | ICD-10-CM

## 2018-10-14 DIAGNOSIS — Z8041 Family history of malignant neoplasm of ovary: Secondary | ICD-10-CM | POA: Diagnosis not present

## 2018-10-14 DIAGNOSIS — Z803 Family history of malignant neoplasm of breast: Secondary | ICD-10-CM | POA: Diagnosis not present

## 2018-10-14 DIAGNOSIS — Z800 Family history of malignant neoplasm of digestive organs: Secondary | ICD-10-CM | POA: Diagnosis not present

## 2018-10-22 ENCOUNTER — Other Ambulatory Visit: Payer: Self-pay | Admitting: Family Medicine

## 2018-10-27 DIAGNOSIS — Z1589 Genetic susceptibility to other disease: Secondary | ICD-10-CM | POA: Insufficient documentation

## 2018-10-29 ENCOUNTER — Other Ambulatory Visit: Payer: Self-pay | Admitting: Family Medicine

## 2018-10-29 DIAGNOSIS — E119 Type 2 diabetes mellitus without complications: Secondary | ICD-10-CM

## 2018-10-31 ENCOUNTER — Other Ambulatory Visit: Payer: Self-pay | Admitting: *Deleted

## 2018-10-31 NOTE — Telephone Encounter (Signed)
Rakes. NTBS 30 days given 09/27/18

## 2018-10-31 NOTE — Telephone Encounter (Signed)
Pt called and aware must be seen to get RF = states that she will call back for appt

## 2018-11-23 ENCOUNTER — Other Ambulatory Visit: Payer: Self-pay | Admitting: Family Medicine

## 2018-11-23 DIAGNOSIS — E119 Type 2 diabetes mellitus without complications: Secondary | ICD-10-CM

## 2018-12-05 ENCOUNTER — Ambulatory Visit: Payer: BLUE CROSS/BLUE SHIELD | Admitting: Family Medicine

## 2018-12-09 ENCOUNTER — Other Ambulatory Visit: Payer: Self-pay | Admitting: Family Medicine

## 2018-12-09 DIAGNOSIS — E119 Type 2 diabetes mellitus without complications: Secondary | ICD-10-CM

## 2018-12-09 DIAGNOSIS — E785 Hyperlipidemia, unspecified: Secondary | ICD-10-CM

## 2018-12-09 NOTE — Telephone Encounter (Signed)
Ov 12/26/18 

## 2018-12-16 ENCOUNTER — Other Ambulatory Visit: Payer: Self-pay | Admitting: Family Medicine

## 2018-12-16 DIAGNOSIS — E119 Type 2 diabetes mellitus without complications: Secondary | ICD-10-CM

## 2018-12-23 ENCOUNTER — Other Ambulatory Visit: Payer: Self-pay

## 2018-12-26 ENCOUNTER — Encounter: Payer: Self-pay | Admitting: Family Medicine

## 2018-12-26 ENCOUNTER — Ambulatory Visit (INDEPENDENT_AMBULATORY_CARE_PROVIDER_SITE_OTHER): Payer: BC Managed Care – PPO | Admitting: Family Medicine

## 2018-12-26 DIAGNOSIS — E119 Type 2 diabetes mellitus without complications: Secondary | ICD-10-CM

## 2018-12-26 DIAGNOSIS — I10 Essential (primary) hypertension: Secondary | ICD-10-CM | POA: Diagnosis not present

## 2018-12-26 DIAGNOSIS — E1159 Type 2 diabetes mellitus with other circulatory complications: Secondary | ICD-10-CM

## 2018-12-26 DIAGNOSIS — J4520 Mild intermittent asthma, uncomplicated: Secondary | ICD-10-CM | POA: Diagnosis not present

## 2018-12-26 DIAGNOSIS — I152 Hypertension secondary to endocrine disorders: Secondary | ICD-10-CM

## 2018-12-26 MED ORDER — ALBUTEROL SULFATE HFA 108 (90 BASE) MCG/ACT IN AERS: INHALATION_SPRAY | RESPIRATORY_TRACT | 11 refills | Status: DC

## 2018-12-26 MED ORDER — LISINOPRIL-HYDROCHLOROTHIAZIDE 20-12.5 MG PO TABS: 1.0000 | ORAL_TABLET | Freq: Every day | ORAL | 1 refills | Status: DC

## 2018-12-26 MED ORDER — GLIPIZIDE 5 MG PO TABS: 5.0000 mg | ORAL_TABLET | Freq: Three times a day (TID) | ORAL | 1 refills | Status: DC

## 2018-12-26 MED ORDER — JANUMET 50-1000 MG PO TABS: 1.0000 | ORAL_TABLET | Freq: Two times a day (BID) | ORAL | 1 refills | Status: DC

## 2018-12-26 NOTE — Progress Notes (Signed)
Virtual Visit via telephone Note Due to COVID-19 pandemic this visit was conducted virtually. This visit type was conducted due to national recommendations for restrictions regarding the COVID-19 Pandemic (e.g. social distancing, sheltering in place) in an effort to limit this patient's exposure and mitigate transmission in our community. All issues noted in this document were discussed and addressed.  A physical exam was not performed with this format.   I connected with Brittany Jenkins on 12/26/18 at 1040 by telephone and verified that I am speaking with the correct person using two identifiers. Brittany Jenkins is currently located at home and family is currently with them during visit. The provider, Kari BaarsMichelle Bearl Talarico, FNP is located in their office at time of visit.  I discussed the limitations, risks, security and privacy concerns of performing an evaluation and management service by telephone and the availability of in person appointments. I also discussed with the patient that there may be a patient responsible charge related to this service. The patient expressed understanding and agreed to proceed.  Subjective:  Patient ID: Brittany Jenkins, female    DOB: 1965-11-01, 53 y.o.   MRN: [id number]  Chief Complaint:  Medical Management of Chronic Issues   HPI: Brittany Jenkins is a 53 y.o. female presenting on 12/26/2018 for Medical Management of Chronic Issues   Pt following up for management of chronic medical conditions. States doing well. Blood sugars running 90-105 and BP averaging 135/75.   Hypertension This is a chronic problem. The current episode started more than 1 year ago. The problem is controlled. Pertinent negatives include no anxiety, blurred vision, chest pain, malaise/fatigue, neck pain, orthopnea, palpitations, peripheral edema, PND, shortness of breath or sweats. Risk factors for coronary artery disease include diabetes mellitus. Past treatments include ACE inhibitors and  diuretics. The current treatment provides significant improvement. There are no compliance problems.   Diabetes She presents for her follow-up diabetic visit. She has type 2 diabetes mellitus. Her disease course has been stable. Pertinent negatives for hypoglycemia include no confusion, dizziness, hunger, mood changes, nervousness/anxiousness, pallor, seizures, sleepiness, speech difficulty, sweats or tremors. Pertinent negatives for diabetes include no blurred vision, no chest pain, no fatigue, no foot paresthesias, no foot ulcerations, no polydipsia, no polyphagia, no polyuria, no visual change, no weakness and no weight loss. Pertinent negatives for hypoglycemia complications include no blackouts, no hospitalization, no nocturnal hypoglycemia, no required assistance and no required glucagon injection. Symptoms are stable. Diabetic complications include heart disease. Risk factors for coronary artery disease include diabetes mellitus and dyslipidemia. Current diabetic treatment includes oral agent (triple therapy). She is compliant with treatment all of the time. Her weight is stable. She is following a generally healthy diet. She participates in exercise intermittently. There is no change in her home blood glucose trend. An ACE inhibitor/angiotensin II receptor blocker is being taken.  Asthma She complains of cough. There is no chest tightness, difficulty breathing, frequent throat clearing, hemoptysis, hoarse voice, shortness of breath, sputum production or wheezing. This is a chronic problem. The current episode started more than 1 year ago. The problem occurs intermittently. The cough is non-productive. Pertinent negatives include no appetite change, chest pain, dyspnea on exertion, fever, malaise/fatigue, myalgias, orthopnea, PND, sweats or weight loss. Her symptoms are aggravated by exercise and strenuous activity. Her symptoms are alleviated by beta-agonist. She reports significant improvement on  treatment. Her past medical history is significant for asthma.     Relevant past medical, surgical, family, and social history reviewed and updated as indicated.  Allergies and medications reviewed and updated.   Past Medical History:  Diagnosis Date   Asthma    Diabetes mellitus without complication (HCC)    GERD (gastroesophageal reflux disease)    Hyperlipidemia    Hypertension    Microalbuminuria     Past Surgical History:  Procedure Laterality Date   HERNIA REPAIR      Social History   Socioeconomic History   Marital status: Married    Spouse name: Not on file   Number of children: Not on file   Years of education: Not on file   Highest education level: Not on file  Occupational History   Not on file  Social Needs   Financial resource strain: Not on file   Food insecurity    Worry: Not on file    Inability: Not on file   Transportation needs    Medical: Not on file    Non-medical: Not on file  Tobacco Use   Smoking status: Former Smoker    Types: Cigarettes    Quit date: 05/18/1998    Years since quitting: 20.6   Smokeless tobacco: Never Used  Substance and Sexual Activity   Alcohol use: Not Currently   Drug use: Never   Sexual activity: Not on file  Lifestyle   Physical activity    Days per week: Not on file    Minutes per session: Not on file   Stress: Not on file  Relationships   Social connections    Talks on phone: Not on file    Gets together: Not on file    Attends religious service: Not on file    Active member of club or organization: Not on file    Attends meetings of clubs or organizations: Not on file    Relationship status: Not on file   Intimate partner violence    Fear of current or ex partner: Not on file    Emotionally abused: Not on file    Physically abused: Not on file    Forced sexual activity: Not on file  Other Topics Concern   Not on file  Social History Narrative   Not on file     Outpatient Encounter Medications as of 12/26/2018  Medication Sig   ADVAIR DISKUS 100-50 MCG/DOSE AEPB TAKE 1 PUFF BY MOUTH TWICE A DAY   albuterol (VENTOLIN HFA) 108 (90 Base) MCG/ACT inhaler As needed for cough and shortness   atorvastatin (LIPITOR) 10 MG tablet Take 0.5 tablets (5 mg total) by mouth daily.   clotrimazole (MYCELEX) 10 MG troche DISSOLVE 1 TROCHE (10 MG TOTAL) IN MOUTH 5 (FIVE) TIMES DAILY.   diphenhydrAMINE (BENADRYL) 25 mg capsule Take 25 mg by mouth every other day.   glipiZIDE (GLUCOTROL) 5 MG tablet Take 1 tablet (5 mg total) by mouth 3 (three) times daily.   lisinopril-hydrochlorothiazide (ZESTORETIC) 20-12.5 MG tablet Take 1 tablet by mouth daily. (Needs to be seen before next refill)   loratadine (CLARITIN) 10 MG tablet Take 1 tablet (10 mg total) by mouth every other day.   omeprazole (PRILOSEC) 20 MG capsule TAKE 1 CAPSULE (20 MG TOTAL) BY MOUTH 2 (TWO) TIMES DAILY BEFORE A MEAL.   sitaGLIPtin-metformin (JANUMET) 50-1000 MG tablet Take 1 tablet by mouth 2 (two) times daily with a meal.   triamcinolone ointment (KENALOG) 0.5 % APPLY TO AFFECTED AREA TWICE A DAY   [DISCONTINUED] albuterol (VENTOLIN HFA) 108 (90 Base) MCG/ACT inhaler INHALE 2 PUFFS BY MOUTH EVERY 4 TO 6 HOURS AS  A RESCUE FOR ASTHMA   [DISCONTINUED] glipiZIDE (GLUCOTROL) 5 MG tablet Take 1 tablet (5 mg total) by mouth 3 (three) times daily.   [DISCONTINUED] JANUMET 50-1000 MG tablet TAKE 1 TABLET BY MOUTH 2 (TWO) TIMES DAILY. (NEEDS TO BE SEEN BEFORE NEXT REFILL)   [DISCONTINUED] lisinopril-hydrochlorothiazide (PRINZIDE,ZESTORETIC) 20-12.5 MG tablet TAKE 1 TABLET BY MOUTH DAILY. (NEEDS TO BE SEEN BEFORE NEXT REFILL)   No facility-administered encounter medications on file as of 12/26/2018.     Allergies  Allergen Reactions   Erythromycin Nausea And Vomiting    Severe vomiting   Victoza [Liraglutide] Diarrhea and Nausea Only    Review of Systems  Constitutional: Negative for  activity change, appetite change, chills, diaphoresis, fatigue, fever, malaise/fatigue, unexpected weight change and weight loss.  HENT: Negative for hoarse voice.   Eyes: Negative for blurred vision, photophobia and visual disturbance.  Respiratory: Positive for cough. Negative for hemoptysis, sputum production, choking, chest tightness, shortness of breath and wheezing.   Cardiovascular: Negative for chest pain, dyspnea on exertion, palpitations, orthopnea, leg swelling and PND.  Gastrointestinal: Negative for abdominal pain, anal bleeding, blood in stool, constipation, diarrhea, nausea and vomiting.  Endocrine: Negative for polydipsia, polyphagia and polyuria.  Genitourinary: Negative for decreased urine volume and difficulty urinating.  Musculoskeletal: Negative for arthralgias, myalgias and neck pain.  Skin: Negative for pallor.  Neurological: Negative for dizziness, tremors, seizures, syncope, facial asymmetry, speech difficulty, weakness, light-headedness and numbness.  Hematological: Does not bruise/bleed easily.  Psychiatric/Behavioral: Negative for confusion. The patient is not nervous/anxious.   All other systems reviewed and are negative.        Observations/Objective: No vital signs or physical exam, this was a telephone or virtual health encounter.  Pt alert and oriented, answers all questions appropriately, and able to speak in full sentences.    Assessment and Plan: Brittany Jenkins was seen today for medical management of chronic issues.  Diagnoses and all orders for this visit:  Mild intermittent asthma without complication Well controlled. Needs refill on PRN SABA.  -     albuterol (VENTOLIN HFA) 108 (90 Base) MCG/ACT inhaler; As needed for cough and shortness  Type 2 diabetes mellitus without complication, without long-term current use of insulin (HCC) Compliant with medications. Needs to be seen soon for labs. Continue below. Diet and exercise encouraged. Report any  persistent high or low readings.  -     sitaGLIPtin-metformin (JANUMET) 50-1000 MG tablet; Take 1 tablet by mouth 2 (two) times daily with a meal. -     glipiZIDE (GLUCOTROL) 5 MG tablet; Take 1 tablet (5 mg total) by mouth 3 (three) times daily.  Hypertension associated with type 2 diabetes mellitus (HCC) Well controlled per pts home BP reports. Needs to be seen to update labs. Diet and exercise encouraged. Report any persistent high or low readings.  -     lisinopril-hydrochlorothiazide (ZESTORETIC) 20-12.5 MG tablet; Take 1 tablet by mouth daily. (Needs to be seen before next refill)     Follow Up Instructions: Return in about 1 month (around 01/26/2019), or if symptoms worsen or fail to improve, for LABS, DM, HTN.    I discussed the assessment and treatment plan with the patient. The patient was provided an opportunity to ask questions and all were answered. The patient agreed with the plan and demonstrated an understanding of the instructions.   The patient was advised to call back or seek an in-person evaluation if the symptoms worsen or if the condition fails to improve as anticipated.  The above assessment and management plan was discussed with the patient. The patient verbalized understanding of and has agreed to the management plan. Patient is aware to call the clinic if symptoms persist or worsen. Patient is aware when to return to the clinic for a follow-up visit. Patient educated on when it is appropriate to go to the emergency department.    I provided 25 minutes of non-face-to-face time during this encounter. The call started at 1040. The call ended at 1105. The other time was used for coordination of care.    Kari BaarsMichelle Eileen Croswell, FNP-C [facility] [address] [place], [place] [id number] [phone] 12/26/18

## 2018-12-31 ENCOUNTER — Other Ambulatory Visit: Payer: Self-pay | Admitting: Family Medicine

## 2018-12-31 DIAGNOSIS — E785 Hyperlipidemia, unspecified: Secondary | ICD-10-CM

## 2019-01-02 NOTE — Telephone Encounter (Signed)
OV 02/22/19

## 2019-01-22 ENCOUNTER — Other Ambulatory Visit: Payer: Self-pay | Admitting: Family Medicine

## 2019-01-22 DIAGNOSIS — J4520 Mild intermittent asthma, uncomplicated: Secondary | ICD-10-CM

## 2019-01-25 ENCOUNTER — Other Ambulatory Visit: Payer: Self-pay | Admitting: *Deleted

## 2019-01-25 DIAGNOSIS — I152 Hypertension secondary to endocrine disorders: Secondary | ICD-10-CM

## 2019-01-25 DIAGNOSIS — E1159 Type 2 diabetes mellitus with other circulatory complications: Secondary | ICD-10-CM

## 2019-01-25 MED ORDER — LISINOPRIL-HYDROCHLOROTHIAZIDE 20-12.5 MG PO TABS: 1.0000 | ORAL_TABLET | Freq: Every day | ORAL | 1 refills | Status: DC

## 2019-01-29 ENCOUNTER — Other Ambulatory Visit: Payer: Self-pay | Admitting: Family Medicine

## 2019-01-29 DIAGNOSIS — E785 Hyperlipidemia, unspecified: Secondary | ICD-10-CM

## 2019-02-09 ENCOUNTER — Other Ambulatory Visit: Payer: Self-pay | Admitting: Family Medicine

## 2019-02-09 DIAGNOSIS — J4520 Mild intermittent asthma, uncomplicated: Secondary | ICD-10-CM

## 2019-02-09 DIAGNOSIS — K219 Gastro-esophageal reflux disease without esophagitis: Secondary | ICD-10-CM

## 2019-02-10 ENCOUNTER — Other Ambulatory Visit: Payer: Self-pay | Admitting: Family Medicine

## 2019-02-10 DIAGNOSIS — E785 Hyperlipidemia, unspecified: Secondary | ICD-10-CM

## 2019-02-22 ENCOUNTER — Ambulatory Visit: Payer: BC Managed Care – PPO | Admitting: Family Medicine

## 2019-02-28 ENCOUNTER — Other Ambulatory Visit: Payer: Self-pay | Admitting: *Deleted

## 2019-03-02 ENCOUNTER — Encounter: Payer: Self-pay | Admitting: Family Medicine

## 2019-07-14 ENCOUNTER — Other Ambulatory Visit: Payer: Self-pay | Admitting: Family Medicine

## 2019-07-14 DIAGNOSIS — E119 Type 2 diabetes mellitus without complications: Secondary | ICD-10-CM

## 2019-08-25 ENCOUNTER — Other Ambulatory Visit: Payer: Self-pay | Admitting: Family Medicine

## 2019-08-25 DIAGNOSIS — E1159 Type 2 diabetes mellitus with other circulatory complications: Secondary | ICD-10-CM

## 2019-08-25 DIAGNOSIS — I152 Hypertension secondary to endocrine disorders: Secondary | ICD-10-CM

## 2019-09-06 DIAGNOSIS — Z23 Encounter for immunization: Secondary | ICD-10-CM | POA: Diagnosis not present

## 2019-09-20 ENCOUNTER — Other Ambulatory Visit: Payer: Self-pay | Admitting: *Deleted

## 2019-09-20 DIAGNOSIS — K219 Gastro-esophageal reflux disease without esophagitis: Secondary | ICD-10-CM

## 2019-09-20 DIAGNOSIS — E119 Type 2 diabetes mellitus without complications: Secondary | ICD-10-CM

## 2019-09-20 MED ORDER — OMEPRAZOLE 20 MG PO CPDR: 20.0000 mg | DELAYED_RELEASE_CAPSULE | Freq: Two times a day (BID) | ORAL | 0 refills | Status: DC

## 2019-09-20 MED ORDER — GLIPIZIDE 5 MG PO TABS: 5.0000 mg | ORAL_TABLET | Freq: Three times a day (TID) | ORAL | 0 refills | Status: DC

## 2019-09-25 ENCOUNTER — Other Ambulatory Visit: Payer: Self-pay | Admitting: *Deleted

## 2019-09-25 DIAGNOSIS — I10 Essential (primary) hypertension: Secondary | ICD-10-CM

## 2019-09-25 DIAGNOSIS — I152 Hypertension secondary to endocrine disorders: Secondary | ICD-10-CM

## 2019-09-25 MED ORDER — LISINOPRIL-HYDROCHLOROTHIAZIDE 20-12.5 MG PO TABS: 1.0000 | ORAL_TABLET | Freq: Every day | ORAL | 0 refills | Status: DC

## 2019-09-25 NOTE — Telephone Encounter (Signed)
One month refill given Appointment scheduled 

## 2019-09-25 NOTE — Telephone Encounter (Signed)
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

## 2019-09-25 NOTE — Addendum Note (Signed)
Addended by: Diamantina Monks on: 09/25/2019 03:43 PM   Modules accepted: Orders

## 2019-10-04 DIAGNOSIS — Z23 Encounter for immunization: Secondary | ICD-10-CM | POA: Diagnosis not present

## 2019-10-17 ENCOUNTER — Other Ambulatory Visit: Payer: Self-pay | Admitting: Nurse Practitioner

## 2019-10-17 DIAGNOSIS — E1159 Type 2 diabetes mellitus with other circulatory complications: Secondary | ICD-10-CM

## 2019-10-17 DIAGNOSIS — I152 Hypertension secondary to endocrine disorders: Secondary | ICD-10-CM

## 2019-10-19 ENCOUNTER — Ambulatory Visit: Payer: BC Managed Care – PPO | Admitting: Nurse Practitioner

## 2019-10-21 ENCOUNTER — Other Ambulatory Visit: Payer: Self-pay | Admitting: Family Medicine

## 2019-10-21 DIAGNOSIS — E119 Type 2 diabetes mellitus without complications: Secondary | ICD-10-CM

## 2019-10-23 NOTE — Telephone Encounter (Signed)
OV 11/23/19 

## 2019-10-30 ENCOUNTER — Other Ambulatory Visit: Payer: Self-pay | Admitting: Nurse Practitioner

## 2019-10-30 ENCOUNTER — Other Ambulatory Visit: Payer: Self-pay | Admitting: Family Medicine

## 2019-10-30 DIAGNOSIS — K219 Gastro-esophageal reflux disease without esophagitis: Secondary | ICD-10-CM

## 2019-10-30 DIAGNOSIS — I152 Hypertension secondary to endocrine disorders: Secondary | ICD-10-CM

## 2019-10-30 DIAGNOSIS — E1159 Type 2 diabetes mellitus with other circulatory complications: Secondary | ICD-10-CM

## 2019-10-30 MED ORDER — OMEPRAZOLE 20 MG PO CPDR: 20.0000 mg | DELAYED_RELEASE_CAPSULE | Freq: Two times a day (BID) | ORAL | 0 refills | Status: DC

## 2019-10-30 NOTE — Telephone Encounter (Signed)
Patient has appointment next month, gave 30 day refill.

## 2019-10-30 NOTE — Telephone Encounter (Signed)
MMM NTBS 30 days given 10/12/19

## 2019-10-30 NOTE — Telephone Encounter (Signed)
Called patient, no answer, left message to call back.

## 2019-10-30 NOTE — Addendum Note (Signed)
Addended by: Adella Hare B on: 10/30/2019 04:34 PM   Modules accepted: Orders

## 2019-11-17 ENCOUNTER — Other Ambulatory Visit: Payer: Self-pay | Admitting: Family Medicine

## 2019-11-17 DIAGNOSIS — E119 Type 2 diabetes mellitus without complications: Secondary | ICD-10-CM

## 2019-11-23 ENCOUNTER — Other Ambulatory Visit: Payer: Self-pay

## 2019-11-23 ENCOUNTER — Ambulatory Visit: Payer: BC Managed Care – PPO | Admitting: Nurse Practitioner

## 2019-11-23 ENCOUNTER — Encounter: Payer: Self-pay | Admitting: Nurse Practitioner

## 2019-11-23 VITALS — BP 137/83 | HR 87 | Temp 98.1°F | Ht 67.0 in | Wt 157.8 lb

## 2019-11-23 DIAGNOSIS — I10 Essential (primary) hypertension: Secondary | ICD-10-CM

## 2019-11-23 DIAGNOSIS — E1165 Type 2 diabetes mellitus with hyperglycemia: Secondary | ICD-10-CM

## 2019-11-23 DIAGNOSIS — J4520 Mild intermittent asthma, uncomplicated: Secondary | ICD-10-CM

## 2019-11-23 DIAGNOSIS — E119 Type 2 diabetes mellitus without complications: Secondary | ICD-10-CM

## 2019-11-23 DIAGNOSIS — E1159 Type 2 diabetes mellitus with other circulatory complications: Secondary | ICD-10-CM

## 2019-11-23 DIAGNOSIS — E785 Hyperlipidemia, unspecified: Secondary | ICD-10-CM

## 2019-11-23 DIAGNOSIS — K219 Gastro-esophageal reflux disease without esophagitis: Secondary | ICD-10-CM

## 2019-11-23 DIAGNOSIS — I152 Hypertension secondary to endocrine disorders: Secondary | ICD-10-CM

## 2019-11-23 LAB — BAYER DCA HB A1C WAIVED: HB A1C (BAYER DCA - WAIVED): 9 % — ABNORMAL HIGH (ref <7.0)

## 2019-11-23 MED ORDER — OMEPRAZOLE 20 MG PO CPDR: 20.0000 mg | DELAYED_RELEASE_CAPSULE | Freq: Two times a day (BID) | ORAL | 0 refills | Status: DC

## 2019-11-23 MED ORDER — ATORVASTATIN CALCIUM 10 MG PO TABS: 5.0000 mg | ORAL_TABLET | Freq: Every day | ORAL | 1 refills | Status: DC

## 2019-11-23 MED ORDER — GLIPIZIDE 5 MG PO TABS: 5.0000 mg | ORAL_TABLET | Freq: Two times a day (BID) | ORAL | 0 refills | Status: DC

## 2019-11-23 MED ORDER — CLOTRIMAZOLE 10 MG MT TROC: OROMUCOSAL | 0 refills | Status: DC

## 2019-11-23 MED ORDER — JANUMET 50-1000 MG PO TABS: 1.0000 | ORAL_TABLET | Freq: Two times a day (BID) | ORAL | 1 refills | Status: DC

## 2019-11-23 MED ORDER — LORATADINE 10 MG PO TABS: 10.0000 mg | ORAL_TABLET | ORAL | 3 refills | Status: DC

## 2019-11-23 MED ORDER — LISINOPRIL-HYDROCHLOROTHIAZIDE 20-12.5 MG PO TABS: 1.0000 | ORAL_TABLET | Freq: Every day | ORAL | 0 refills | Status: DC

## 2019-11-23 MED ORDER — IPRATROPIUM-ALBUTEROL 0.5-2.5 (3) MG/3ML IN SOLN: 3.0000 mL | Freq: Four times a day (QID) | RESPIRATORY_TRACT | 2 refills | Status: DC | PRN

## 2019-11-23 MED ORDER — ALBUTEROL SULFATE HFA 108 (90 BASE) MCG/ACT IN AERS: INHALATION_SPRAY | RESPIRATORY_TRACT | 3 refills | Status: DC

## 2019-11-23 MED ORDER — TRIAMCINOLONE ACETONIDE 0.5 % EX OINT: TOPICAL_OINTMENT | CUTANEOUS | 0 refills | Status: DC

## 2019-11-23 NOTE — Assessment & Plan Note (Signed)
Concerning patient's diabetes mellitus type 2, patient is here for follow-up she was first diagnosed in 2016.  No current symptoms or problems.  Current medication includes Janumet 50-2000 mg tablet twice daily, glipizide 5 mg tablet daily. Provided education to patient with written handouts given.  Completed foot exams. Labs collected. Patient follow-up in 3 months. Rx refill sent to pharmacy.

## 2019-11-23 NOTE — Assessment & Plan Note (Signed)
Patient is a 54 year old female who presents to clinic for follow-up of hypertension.  Patient was diagnosed in 03/21/2014.  The patient is tolerating the medication well without side effects.  Compliance with treatment has been good including taking medication as directed.  Current medication is lisinopril-hydrochlorothiazide 20-12.5 mg tablet daily.  She maintains a healthy diet and following up as directed Medication refill sent to pharmacy Provided education with printed handout given to patient.

## 2019-11-23 NOTE — Assessment & Plan Note (Signed)
Patient presents with hyperlipidemia, patient was diagnosed in 2015.  Compliance with treatment has been good.  Patient is compliant with medications, current medication atorvastatin 10 mg patient takes half a tablet daily.  She is maintaining a low-cholesterol diet she follows up as directed and maintains an exercise regimen she is not having any current hypercholesterolemia related symptoms. Labs collected Refill sent to pharmacy

## 2019-11-23 NOTE — Patient Instructions (Addendum)
Hyperlipidemia Patient presents with hyperlipidemia, patient was diagnosed in 2015.  Compliance with treatment has been good.  Patient is compliant with medications, current medication atorvastatin 10 mg patient takes half a tablet daily.  She is maintaining a low-cholesterol diet she follows up as directed and maintains an exercise regimen she is not having any current hypercholesterolemia related symptoms. Labs collected Refill sent to pharmacy   Hypertension associated with type 2 diabetes mellitus [facility](HCC) Patient is a 54 year old female who presents to clinic for follow-up of hypertension.  Patient was diagnosed in 03/21/2014.  The patient is tolerating the medication well without side effects.  Compliance with treatment has been good including taking medication as directed.  Current medication is lisinopril-hydrochlorothiazide 20-12.5 mg tablet daily.  She maintains a healthy diet and following up as directed Medication refill sent to pharmacy Provided education with printed handout given to patient.  DM (diabetes mellitus), type 2, uncontrolled Concerning patient's diabetes mellitus type 2, patient is here for follow-up she was first diagnosed in 2016.  No current symptoms or problems.  Current medication includes Janumet 50-2000 mg tablet twice daily, glipizide 5 mg tablet daily. Provided education to patient with written handouts given.  Completed foot exams. Labs collected. Patient follow-up in 3 months. Rx refill sent to pharmacy.  Marland Kitchen.p Diabetes Basics  Diabetes (diabetes mellitus) is a long-term (chronic) disease. It occurs when the body does not properly use sugar (glucose) that is released from food after you eat. Diabetes may be caused by one or both of these problems:  Your pancreas does not make enough of a hormone called insulin.  Your body does not react in a normal way to insulin that it makes. Insulin lets sugars (glucose) go into cells in your body. This gives you energy.  If you have diabetes, sugars cannot get into cells. This causes high blood sugar (hyperglycemia). Follow these instructions at home: How is diabetes treated? You may need to take insulin or other diabetes medicines daily to keep your blood sugar in balance. Take your diabetes medicines every day as told by your doctor. List your diabetes medicines here: Diabetes medicines  Name of medicine: ______________________________ ? Amount (dose): _______________ Time (a.m./p.m.): _______________ Notes: ___________________________________  Name of medicine: ______________________________ ? Amount (dose): _______________ Time (a.m./p.m.): _______________ Notes: ___________________________________  Name of medicine: ______________________________ ? Amount (dose): _______________ Time (a.m./p.m.): _______________ Notes: ___________________________________ If you use insulin, you will learn how to give yourself insulin by injection. You may need to adjust the amount based on the food that you eat. List the types of insulin you use here: Insulin  Insulin type: ______________________________ ? Amount (dose): _______________ Time (a.m./p.m.): _______________ Notes: ___________________________________  Insulin type: ______________________________ ? Amount (dose): _______________ Time (a.m./p.m.): _______________ Notes: ___________________________________  Insulin type: ______________________________ ? Amount (dose): _______________ Time (a.m./p.m.): _______________ Notes: ___________________________________  Insulin type: ______________________________ ? Amount (dose): _______________ Time (a.m./p.m.): _______________ Notes: ___________________________________  Insulin type: ______________________________ ? Amount (dose): _______________ Time (a.m./p.m.): _______________ Notes: ___________________________________ How do I manage my blood sugar?  Check your blood sugar levels using a blood glucose  monitor as directed by your doctor. Your doctor will set treatment goals for you. Generally, you should have these blood sugar levels:  Before meals (preprandial): 80-130 mg/dL (1.6-1.04.4-7.2 mmol/L).  After meals (postprandial): below 180 mg/dL (10 mmol/L).  A1c level: less than 7%. Write down the times that you will check your blood sugar levels: Blood sugar checks  Time: _______________ Notes: ___________________________________  Time: _______________ Notes: ___________________________________  Time: _______________  Notes: ___________________________________  Time: _______________ Notes: ___________________________________  Time: _______________ Notes: ___________________________________  Time: _______________ Notes: ___________________________________  What do I need to know about low blood sugar? Low blood sugar is called hypoglycemia. This is when blood sugar is at or below 70 mg/dL (3.9 mmol/L). Symptoms may include:  Feeling: ? Hungry. ? Worried or nervous (anxious). ? Sweaty and clammy. ? Confused. ? Dizzy. ? Sleepy. ? Sick to your stomach (nauseous).  Having: ? A fast heartbeat. ? A headache. ? A change in your vision. ? Tingling or no feeling (numbness) around the mouth, lips, or tongue. ? Jerky movements that you cannot control (seizure).  Having trouble with: ? Moving (coordination). ? Sleeping. ? Passing out (fainting). ? Getting upset easily (irritability). Treating low blood sugar To treat low blood sugar, eat or drink something sugary right away. If you can think clearly and swallow safely, follow the 15:15 rule:  Take 15 grams of a fast-acting carb (carbohydrate). Talk with your doctor about how much you should take.  Some fast-acting carbs are: ? Sugar tablets (glucose pills). Take 3-4 glucose pills. ? 6-8 pieces of hard candy. ? 4-6 oz (120-150 mL) of fruit juice. ? 4-6 oz (120-150 mL) of regular (not diet) soda. ? 1 Tbsp (15 mL) honey or  sugar.  Check your blood sugar 15 minutes after you take the carb.  If your blood sugar is still at or below 70 mg/dL (3.9 mmol/L), take 15 grams of a carb again.  If your blood sugar does not go above 70 mg/dL (3.9 mmol/L) after 3 tries, get help right away.  After your blood sugar goes back to normal, eat a meal or a snack within 1 hour. Treating very low blood sugar If your blood sugar is at or below 54 mg/dL (3 mmol/L), you have very low blood sugar (severe hypoglycemia). This is an emergency. Do not wait to see if the symptoms will go away. Get medical help right away. Call your local emergency services (911 in the [place] Do not drive yourself to the hospital. Questions to ask your health care provider  Do I need to meet with a diabetes educator?  What equipment will I need to care for myself at home?  What diabetes medicines do I need? When should I take them?  How often do I need to check my blood sugar?  What number can I call if I have questions?  When is my next doctor's visit?  Where can I find a support group for people with diabetes? Where to find more information  American Diabetes Association: www.diabetes.org  American Association of Diabetes Educators: www.diabeteseducator.org/patient-resources Contact a doctor if:  Your blood sugar is at or above 240 mg/dL (07.1 mmol/L) for 2 days in a row.  You have been sick or have had a fever for 2 days or more, and you are not getting better.  You have any of these problems for more than 6 hours: ? You cannot eat or drink. ? You feel sick to your stomach (nauseous). ? You throw up (vomit). ? You have watery poop (diarrhea). Get help right away if:  Your blood sugar is lower than 54 mg/dL (3 mmol/L).  You get confused.  You have trouble: ? Thinking clearly. ? Breathing. Summary  Diabetes (diabetes mellitus) is a long-term (chronic) disease. It occurs when the body does not properly use sugar (glucose) that is  released from food after digestion.  Take insulin and diabetes medicines as told.  Check  your blood sugar every day, as often as told.  Keep all follow-up visits as told by your doctor. This is important. This information is not intended to replace advice given to you by your health care provider. Make sure you discuss any questions you have with your health care provider. Document Revised: 01/25/2019 Document Reviewed: 08/06/2017 Elsevier Patient Education  2020 [employer] High Triglycerides Eating Plan Triglycerides are a type of fat in the blood. High levels of triglycerides can increase your risk of heart disease and stroke. If your triglyceride levels are high, choosing the right foods can help lower your triglycerides and keep your heart healthy. Work with your health care provider or a diet and nutrition specialist (dietitian) to develop an eating plan that is right for you. What are tips for following this plan? General guidelines   Lose weight, if you are overweight. For most people, losing 5-10 lbs (2-5 kg) helps lower triglyceride levels. A weight-loss plan may include. ? 30 minutes of exercise at least 5 days a week. ? Reducing the amount of calories, sugar, and fat you eat.  Eat a wide variety of fresh fruits, vegetables, and whole grains. These foods are high in fiber.  Eat foods that contain healthy fats, such as fatty fish, nuts, seeds, and olive oil.  Avoid foods that are high in added sugar, added salt (sodium), saturated fat, and trans fat.  Avoid low-fiber, refined carbohydrates such as white bread, crackers, noodles, and white rice.  Avoid foods with partially hydrogenated oils (trans fats), such as fried foods or stick margarine.  Limit alcohol intake to no more than 1 drink a day for nonpregnant women and 2 drinks a day for men. One drink equals 12 oz of beer, 5 oz of wine, or 1 oz of hard liquor. Your health care provider may recommend that you drink less  depending on your overall health. Reading food labels  Check food labels for the amount of saturated fat. Choose foods with no or very little saturated fat.  Check food labels for the amount of trans fat. Choose foods with no trans fat.  Check food labels for the amount of cholesterol. Choose foods low in cholesterol. Ask your dietitian how much cholesterol you should have each day.  Check food labels for the amount of sodium. Choose foods with less than 140 milligrams (mg) per serving. Shopping  Buy dairy products labeled as nonfat (skim) or low-fat (1%).  Avoid buying processed or prepackaged foods. These are often high in added sugar, sodium, and fat. Cooking  Choose healthy fats when cooking, such as olive oil or canola oil.  Cook foods using lower fat methods, such as baking, broiling, boiling, or grilling.  Make your own sauces, dressings, and marinades when possible, instead of buying them. Store-bought sauces, dressings, and marinades are often high in sodium and sugar. Meal planning  Eat more home-cooked food and less restaurant, buffet, and fast food.  Eat fatty fish at least 2 times each week. Examples of fatty fish include salmon, trout, mackerel, tuna, and herring.  If you eat whole eggs, do not eat more than 3 egg yolks per week. What foods are recommended? The items listed may not be a complete list. Talk with your dietitian about what dietary choices are best for you. Grains Whole wheat or whole grain breads, crackers, cereals, and pasta. Unsweetened oatmeal. Bulgur. Barley. Quinoa. Brown rice. Whole wheat flour tortillas. Vegetables Fresh or frozen vegetables. Low-sodium canned vegetables. Fruits All fresh, canned (  in natural juice), or frozen fruits. Meats and other protein foods Skinless chicken or [place]. Ground chicken or [place]. Lean cuts of pork, trimmed of fat. Fish and seafood, especially salmon, trout, and herring. Egg whites. Dried beans, peas, or  lentils. Unsalted nuts or seeds. Unsalted canned beans. Natural peanut or almond butter. Dairy Low-fat dairy products. Skim or low-fat (1%) milk. Reduced fat (2%) and low-sodium cheese. Low-fat ricotta cheese. Low-fat cottage cheese. Plain, low-fat yogurt. Fats and oils Tub margarine without trans fats. Light or reduced-fat mayonnaise. Light or reduced-fat salad dressings. Avocado. Safflower, olive, sunflower, soybean, and canola oils. What foods are not recommended? The items listed may not be a complete list. Talk with your dietitian about what dietary choices are best for you. Grains White bread. White (regular) pasta. White rice. Cornbread. Bagels. Pastries. Crackers that contain trans fat. Vegetables Creamed or fried vegetables. Vegetables in a cheese sauce. Fruits Sweetened dried fruit. Canned fruit in syrup. Fruit juice. Meats and other protein foods Fatty cuts of meat. Ribs. Chicken wings. Tomasa Blase. Sausage. Bologna. Salami. Chitterlings. Fatback. Hot dogs. Bratwurst. Packaged lunch meats. Dairy Whole or reduced-fat (2%) milk. Half-and-half. Cream cheese. Full-fat or sweetened yogurt. Full-fat cheese. Nondairy creamers. Whipped toppings. Processed cheese or cheese spreads. Cheese curds. Beverages Alcohol. Sweetened drinks, such as soda, lemonade, fruit drinks, or punches. Fats and oils Butter. Stick margarine. Lard. Shortening. Ghee. Bacon fat. Tropical oils, such as coconut, palm kernel, or palm oils. Sweets and desserts Corn syrup. Sugars. Honey. Molasses. Candy. Jam and jelly. Syrup. Sweetened cereals. Cookies. Pies. Cakes. Donuts. Muffins. Ice cream. Condiments Store-bought sauces, dressings, and marinades that are high in sugar, such as ketchup and barbecue sauce. Summary  High levels of triglycerides can increase the risk of heart disease and stroke. Choosing the right foods can help lower your triglycerides.  Eat plenty of fresh fruits, vegetables, and whole grains. Choose  low-fat dairy and lean meats. Eat fatty fish at least twice a week.  Avoid processed and prepackaged foods with added sugar, sodium, saturated fat, and trans fat.  If you need suggestions or have questions about what types of food are good for you, talk with your health care provider or a dietitian. This information is not intended to replace advice given to you by your health care provider. Make sure you discuss any questions you have with your health care provider. Document Revised: 04/16/2017 Document Reviewed: 07/07/2016 Elsevier Patient Education  2020 [employer]. Hypertension, Adult Hypertension is another name for high blood pressure. High blood pressure forces your heart to work harder to pump blood. This can cause problems over time. There are two numbers in a blood pressure reading. There is a top number (systolic) over a bottom number (diastolic). It is best to have a blood pressure that is below 120/80. Healthy choices can help lower your blood pressure, or you may need medicine to help lower it. What are the causes? The cause of this condition is not known. Some conditions may be related to high blood pressure. What increases the risk?  Smoking.  Having type 2 diabetes mellitus, high cholesterol, or both.  Not getting enough exercise or physical activity.  Being overweight.  Having too much fat, sugar, calories, or salt (sodium) in your diet.  Drinking too much alcohol.  Having long-term (chronic) kidney disease.  Having a family history of high blood pressure.  Age. Risk increases with age.  Race. You may be at higher risk if you are African American.  Gender. Men are  at higher risk than women before age 53. After age 93, women are at higher risk than men.  Having obstructive sleep apnea.  Stress. What are the signs or symptoms?  High blood pressure may not cause symptoms. Very high blood pressure (hypertensive crisis) may cause: ? Headache. ? Feelings of  worry or nervousness (anxiety). ? Shortness of breath. ? Nosebleed. ? A feeling of being sick to your stomach (nausea). ? Throwing up (vomiting). ? Changes in how you see. ? Very bad chest pain. ? Seizures. How is this treated?  This condition is treated by making healthy lifestyle changes, such as: ? Eating healthy foods. ? Exercising more. ? Drinking less alcohol.  Your health care provider may prescribe medicine if lifestyle changes are not enough to get your blood pressure under control, and if: ? Your top number is above 130. ? Your bottom number is above 80.  Your personal target blood pressure may vary. Follow these instructions at home: Eating and drinking   If told, follow the DASH eating plan. To follow this plan: ? Fill one half of your plate at each meal with fruits and vegetables. ? Fill one fourth of your plate at each meal with whole grains. Whole grains include whole-wheat pasta, brown rice, and whole-grain bread. ? Eat or drink low-fat dairy products, such as skim milk or low-fat yogurt. ? Fill one fourth of your plate at each meal with low-fat (lean) proteins. Low-fat proteins include fish, chicken without skin, eggs, beans, and tofu. ? Avoid fatty meat, cured and processed meat, or chicken with skin. ? Avoid pre-made or processed food.  Eat less than 1,500 mg of salt each day.  Do not drink alcohol if: ? Your doctor tells you not to drink. ? You are pregnant, may be pregnant, or are planning to become pregnant.  If you drink alcohol: ? Limit how much you use to:  0-1 drink a day for women.  0-2 drinks a day for men. ? Be aware of how much alcohol is in your drink. In the [place] one drink equals one 12 oz bottle of beer (355 mL), one 5 oz glass of wine (148 mL), or one 1 oz glass of hard liquor (44 mL). Lifestyle   Work with your doctor to stay at a healthy weight or to lose weight. Ask your doctor what the best weight is for you.  Get at least 30  minutes of exercise most days of the week. This may include walking, swimming, or biking.  Get at least 30 minutes of exercise that strengthens your muscles (resistance exercise) at least 3 days a week. This may include lifting weights or doing Pilates.  Do not use any products that contain nicotine or tobacco, such as cigarettes, e-cigarettes, and chewing tobacco. If you need help quitting, ask your doctor.  Check your blood pressure at home as told by your doctor.  Keep all follow-up visits as told by your doctor. This is important. Medicines  Take over-the-counter and prescription medicines only as told by your doctor. Follow directions carefully.  Do not skip doses of blood pressure medicine. The medicine does not work as well if you skip doses. Skipping doses also puts you at risk for problems.  Ask your doctor about side effects or reactions to medicines that you should watch for. Contact a doctor if you:  Think you are having a reaction to the medicine you are taking.  Have headaches that keep coming back (recurring).  Feel dizzy.  Have swelling in your ankles.  Have trouble with your vision. Get help right away if you:  Get a very bad headache.  Start to feel mixed up (confused).  Feel weak or numb.  Feel faint.  Have very bad pain in your: ? Chest. ? Belly (abdomen).  Throw up more than once.  Have trouble breathing. Summary  Hypertension is another name for high blood pressure.  High blood pressure forces your heart to work harder to pump blood.  For most people, a normal blood pressure is less than 120/80.  Making healthy choices can help lower blood pressure. If your blood pressure does not get lower with healthy choices, you may need to take medicine. This information is not intended to replace advice given to you by your health care provider. Make sure you discuss any questions you have with your health care provider. Document Revised: 01/12/2018  Document Reviewed: 01/12/2018 Elsevier Patient Education  2020 [employer].

## 2019-11-23 NOTE — Progress Notes (Signed)
Established Patient Office Visit  Subjective:  Patient ID: Brittany Jenkins, female    DOB: July 17, 1965  Age: 54 y.o. MRN: [id number]  CC: Medication refill with chronic disease management.  HPI Brittany Jenkins presents for follow up of hypertension. Patient was diagnosed in 03/21/2014 The patient is tolerating the medication well without side effects. Compliance with treatment has been good; including taking medication as directed, current medication lisinopril-hydrochlorothiazide 20-12.5 mg tablet daily.  She maintains a healthy diet, and following up as directed.   Mixed hyperlipidemia  Patient presents with hyperlipidemia. Patient was diagnosed in 2015. Compliance with treatment has been good; The patient is compliant with medications, current medication atorvastatin 10 mg tablet patient takes half tablet daily.  Maintains a low cholesterol diet , follows up as directed , and maintains an exercise regimen . The patient denies experiencing any hypercholesterolemia related symptoms.    Diabetes Mellitus Type II, Follow-up: Patient here for follow-up of Type 2 diabetes mellitus.  Diagnosed in 2016.  No current symptoms/problems.  Current medication includes: Janumet 50- 1000 mg tablet twice daily, glipizide 5 mg tablet daily.  Known diabetic complications: No diabetic complications Cardiovascular risk factors: Hyperlipidemia Current diabetic medications include Janumet 50-1000 mg tablet twice daily, glipizide 5 mg tablet daily  Eye exam current (within one year): Patient has an eye appointment in the next 2 weeks. Weight trend: BMI appropriate. Prior visit with dietician: No  current diet: Balanced diet Current exercise: Patient reports walking 30 minutes daily  Current monitoring regimen: Blood glucose measurements at home Home blood sugar records: Patient did not bring blood pressure logs to today's visit Any episodes of hypoglycemia?  Is She on ACE inhibitor or angiotensin II  receptor blocker?  Yes     Past Medical History:  Diagnosis Date  . Asthma   . Diabetes mellitus without complication ([place])   . GERD (gastroesophageal reflux disease)   . Hyperlipidemia   . Hypertension   . Microalbuminuria     Past Surgical History:  Procedure Laterality Date  . HERNIA REPAIR      Family History  Problem Relation Age of Onset  . Arthritis Mother   . Asthma Mother   . Diabetes Mother   . Hypertension Mother   . Hyperlipidemia Mother   . Arthritis Father   . Diabetes Father   . Hypertension Father   . Hyperlipidemia Father   . GER disease Father     Social History   Socioeconomic History  . Marital status: Married    Spouse name: Not on file  . Number of children: Not on file  . Years of education: Not on file  . Highest education level: Not on file  Occupational History  . Not on file  Tobacco Use  . Smoking status: Former Smoker    Types: Cigarettes    Quit date: 05/18/1998    Years since quitting: 21.5  . Smokeless tobacco: Never Used  Vaping Use  . Vaping Use: Never used  Substance and Sexual Activity  . Alcohol use: Not Currently  . Drug use: Never  . Sexual activity: Not on file  Other Topics Concern  . Not on file  Social History Narrative  . Not on file   Social Determinants of Health   Financial Resource Strain:   . Difficulty of Paying Living Expenses:   Food Insecurity:   . Worried About Charity fundraiser in the Last Year:   . [place] in the Last Year:  Transportation Needs:   . Film/video editor (Medical):   Marland Kitchen Lack of Transportation (Non-Medical):   Physical Activity:   . Days of Exercise per Week:   . Minutes of Exercise per Session:   Stress:   . Feeling of Stress :   Social Connections:   . Frequency of Communication with Friends and Family:   . Frequency of Social Gatherings with Friends and Family:   . Attends Religious Services:   . Active Member of Clubs or Organizations:   . Attends English as a second language teacher Meetings:   Marland Kitchen Marital Status:   Intimate Partner Violence:   . Fear of Current or Ex-Partner:   . Emotionally Abused:   Marland Kitchen Physically Abused:   . Sexually Abused:     Outpatient Medications Prior to Visit  Medication Sig Dispense Refill  . ADVAIR DISKUS 100-50 MCG/DOSE AEPB INHALE 1 PUFF BY MOUTH TWICE A DAY 180 each 1  . albuterol (VENTOLIN HFA) 108 (90 Base) MCG/ACT inhaler INHALE 2 PUFFS BY MOUTH EVERY 4 TO 6 HOURS AS A RESCUE FOR ASTHMA 18 g 3  . atorvastatin (LIPITOR) 10 MG tablet TAKE 1/2 TABLET BY MOUTH DAILY 45 tablet 1  . clotrimazole (MYCELEX) 10 MG troche DISSOLVE 1 TROCHE (10 MG TOTAL) IN MOUTH 5 (FIVE) TIMES DAILY. 70 tablet 0  . diphenhydrAMINE (BENADRYL) 25 mg capsule Take 25 mg by mouth every other day.    Marland Kitchen glipiZIDE (GLUCOTROL) 5 MG tablet TAKE 1 TABLET (5 MG TOTAL) BY MOUTH 3 (THREE) TIMES DAILY. (NEEDS TO BE SEEN BEFORE NEXT REFILL) (Patient taking differently: Take 5 mg by mouth 2 (two) times daily before a meal. (Needs to be seen before next refill)) 90 tablet 0  . lisinopril-hydrochlorothiazide (ZESTORETIC) 20-12.5 MG tablet Take 1 tablet by mouth daily. 30 tablet 0  . omeprazole (PRILOSEC) 20 MG capsule Take 1 capsule (20 mg total) by mouth 2 (two) times daily before a meal. 60 capsule 0  . JANUMET 50-1000 MG tablet TAKE 1 TABLET BY MOUTH 2 (TWO) TIMES DAILY WITH A MEAL. 180 tablet 1  . loratadine (CLARITIN) 10 MG tablet Take 1 tablet (10 mg total) by mouth every other day. 15 tablet 3  . triamcinolone ointment (KENALOG) 0.5 % APPLY TO AFFECTED AREA TWICE A DAY (Patient not taking: Reported on 11/23/2019) 30 g 0   No facility-administered medications prior to visit.    Allergies  Allergen Reactions  . Erythromycin Nausea And Vomiting    Severe vomiting  . Victoza [Liraglutide] Diarrhea and Nausea Only    ROS Review of Systems  Constitutional: Negative.   HENT: Negative.   Eyes: Negative.   Respiratory: Negative.   Gastrointestinal: Negative.    Endocrine: Negative for cold intolerance and heat intolerance.  Genitourinary: Negative.   Musculoskeletal: Negative.   Skin: Negative.   Neurological: Negative.       Objective:    Physical Exam Vitals reviewed.  Constitutional:      Appearance: Normal appearance.  Eyes:     Conjunctiva/sclera: Conjunctivae normal.  Cardiovascular:     Rate and Rhythm: Normal rate and regular rhythm.     Pulses: Normal pulses.     Heart sounds: Normal heart sounds.  Pulmonary:     Effort: Pulmonary effort is normal.     Breath sounds: Normal breath sounds.  Abdominal:     General: Bowel sounds are normal.  Musculoskeletal:        General: No tenderness.     Cervical back: Neck supple.  Skin:    General: Skin is warm.     Findings: No erythema.  Neurological:     Mental Status: She is alert and oriented to person, place, and time.     BP 137/83   Pulse 87   Temp 98.1 F (36.7 C)   Ht 5' 7"  (1.702 m)   Wt 157 lb 12.8 oz (71.6 kg)   LMP 09/07/2015   SpO2 94%   BMI 24.71 kg/m  Wt Readings from Last 3 Encounters:  11/23/19 157 lb 12.8 oz (71.6 kg)  07/25/18 160 lb (72.6 kg)  05/16/18 160 lb (72.6 kg)     Health Maintenance Due  Topic Date Due  . COVID-19 Vaccine (1) Never done  . COLONOSCOPY  Never done  . HEMOGLOBIN A1C  11/15/2018  . OPHTHALMOLOGY EXAM  03/16/2019  . FOOT EXAM  05/17/2019    There are no preventive care reminders to display for this patient.  Lab Results  Component Value Date   TSH 1.300 05/16/2018   Lab Results  Component Value Date   WBC 8.1 05/16/2018   HGB 13.9 05/16/2018   HCT 41.7 05/16/2018   MCV 90 05/16/2018   PLT 379 05/16/2018   Lab Results  Component Value Date   NA 142 05/16/2018   K 4.2 05/16/2018   CO2 21 05/16/2018   GLUCOSE 200 (H) 05/16/2018   BUN 13 05/16/2018   CREATININE 0.72 05/16/2018   BILITOT 0.3 05/16/2018   ALKPHOS 64 05/16/2018   AST 21 05/16/2018   ALT 34 (H) 05/16/2018   PROT 6.8 05/16/2018    ALBUMIN 4.4 05/16/2018   CALCIUM 9.9 05/16/2018   Lab Results  Component Value Date   CHOL 177 05/16/2018   Lab Results  Component Value Date   HDL 39 (L) 05/16/2018   Lab Results  Component Value Date   LDLCALC 94 05/16/2018   Lab Results  Component Value Date   TRIG 220 (H) 05/16/2018   Lab Results  Component Value Date   CHOLHDL 4.5 (H) 05/16/2018   Lab Results  Component Value Date   HGBA1C 9.0 (H) 11/23/2019      Assessment & Plan:  Hyperlipidemia Patient presents with hyperlipidemia, patient was diagnosed in 2015.  Compliance with treatment has been good.  Patient is compliant with medications, current medication atorvastatin 10 mg patient takes half a tablet daily.  She is maintaining a low-cholesterol diet she follows up as directed and maintains an exercise regimen she is not having any current hypercholesterolemia related symptoms. Labs collected Refill sent to pharmacy   Hypertension associated with type 2 diabetes mellitus [facility]) Patient is a 54 year old female who presents to clinic for follow-up of hypertension.  Patient was diagnosed in 03/21/2014.  The patient is tolerating the medication well without side effects.  Compliance with treatment has been good including taking medication as directed.  Current medication is lisinopril-hydrochlorothiazide 20-12.5 mg tablet daily.  She maintains a healthy diet and following up as directed Medication refill sent to pharmacy Provided education with printed handout given to patient.  DM (diabetes mellitus), type 2, uncontrolled Concerning patient's diabetes mellitus type 2, patient is here for follow-up she was first diagnosed in 2016.  No current symptoms or problems.  Current medication includes Janumet 50-2000 mg tablet twice daily, glipizide 5 mg tablet daily. Provided education to patient with written handouts given.  Completed foot exams. Labs collected. Patient follow-up in 3 months. Rx refill sent to  pharmacy.  Problem List Items Addressed  This Visit      Cardiovascular and Mediastinum   Hypertension associated with type 2 diabetes mellitus ([place])   Relevant Orders   CMP14+EGFR     Endocrine   Type 2 diabetes mellitus without complication, without long-term current use of insulin ([place]) - Primary   Relevant Orders   Bayer DCA Hb A1c Waived    Other Visit Diagnoses    Hyperlipidemia, unspecified hyperlipidemia type       Relevant Orders   Lipid panel      No orders of the defined types were placed in this encounter.   Follow-up: No follow-ups on file.    Ivy Lynn, NP

## 2019-11-24 ENCOUNTER — Other Ambulatory Visit: Payer: Self-pay | Admitting: Nurse Practitioner

## 2019-11-24 LAB — CBC WITH DIFFERENTIAL/PLATELET
Basophils Absolute: 0.1 10*3/uL (ref 0.0–0.2)
Basos: 1 %
EOS (ABSOLUTE): 0.3 10*3/uL (ref 0.0–0.4)
Eos: 4 %
Hematocrit: 34.6 % (ref 34.0–46.6)
Hemoglobin: 11.1 g/dL (ref 11.1–15.9)
Immature Grans (Abs): 0.1 10*3/uL (ref 0.0–0.1)
Immature Granulocytes: 2 %
Lymphocytes Absolute: 2 10*3/uL (ref 0.7–3.1)
Lymphs: 27 %
MCH: 25.9 pg — ABNORMAL LOW (ref 26.6–33.0)
MCHC: 32.1 g/dL (ref 31.5–35.7)
MCV: 81 fL (ref 79–97)
Monocytes Absolute: 0.4 10*3/uL (ref 0.1–0.9)
Monocytes: 6 %
Neutrophils Absolute: 4.5 10*3/uL (ref 1.4–7.0)
Neutrophils: 60 %
Platelets: 453 10*3/uL — ABNORMAL HIGH (ref 150–450)
RBC: 4.29 x10E6/uL (ref 3.77–5.28)
RDW: 15.8 % — ABNORMAL HIGH (ref 11.7–15.4)
WBC: 7.3 10*3/uL (ref 3.4–10.8)

## 2019-11-24 LAB — CMP14+EGFR
ALT: 19 IU/L (ref 0–32)
AST: 14 IU/L (ref 0–40)
Albumin/Globulin Ratio: 1.8 (ref 1.2–2.2)
Albumin: 4.2 g/dL (ref 3.8–4.9)
Alkaline Phosphatase: 75 IU/L (ref 48–121)
BUN/Creatinine Ratio: 18 (ref 9–23)
BUN: 12 mg/dL (ref 6–24)
Bilirubin Total: <0.2 mg/dL (ref 0.0–1.2)
CO2: 26 mmol/L (ref 20–29)
Calcium: 9.3 mg/dL (ref 8.7–10.2)
Chloride: 99 mmol/L (ref 96–106)
Creatinine, Ser: 0.66 mg/dL (ref 0.57–1.00)
GFR calc Af Amer: 115 mL/min/{1.73_m2} (ref >59)
GFR calc non Af Amer: 100 mL/min/{1.73_m2} (ref >59)
Globulin, Total: 2.4 g/dL (ref 1.5–4.5)
Glucose: 297 mg/dL — ABNORMAL HIGH (ref 65–99)
Potassium: 3.6 mmol/L (ref 3.5–5.2)
Sodium: 140 mmol/L (ref 134–144)
Total Protein: 6.6 g/dL (ref 6.0–8.5)

## 2019-11-24 LAB — LIPID PANEL
Chol/HDL Ratio: 5.3 ratio — ABNORMAL HIGH (ref 0.0–4.4)
Cholesterol, Total: 174 mg/dL (ref 100–199)
HDL: 33 mg/dL — ABNORMAL LOW (ref >39)
LDL Chol Calc (NIH): 89 mg/dL (ref 0–99)
Triglycerides: 311 mg/dL — ABNORMAL HIGH (ref 0–149)
VLDL Cholesterol Cal: 52 mg/dL — ABNORMAL HIGH (ref 5–40)

## 2019-11-24 LAB — TSH: TSH: 1.14 u[IU]/mL (ref 0.450–4.500)

## 2019-11-24 MED ORDER — GLIPIZIDE 10 MG PO TABS
10.0000 mg | ORAL_TABLET | Freq: Two times a day (BID) | ORAL | 0 refills | Status: DC
Start: 2019-11-24 — End: 2020-01-05

## 2019-11-24 MED ORDER — ATORVASTATIN CALCIUM 20 MG PO TABS
20.0000 mg | ORAL_TABLET | Freq: Every day | ORAL | 0 refills | Status: DC
Start: 2019-11-24 — End: 2020-08-08

## 2019-11-25 ENCOUNTER — Other Ambulatory Visit: Payer: Self-pay | Admitting: Nurse Practitioner

## 2019-11-25 DIAGNOSIS — I152 Hypertension secondary to endocrine disorders: Secondary | ICD-10-CM

## 2019-12-25 ENCOUNTER — Other Ambulatory Visit: Payer: Self-pay | Admitting: Nurse Practitioner

## 2019-12-25 ENCOUNTER — Other Ambulatory Visit: Payer: Self-pay | Admitting: Family Medicine

## 2019-12-25 DIAGNOSIS — E1159 Type 2 diabetes mellitus with other circulatory complications: Secondary | ICD-10-CM

## 2019-12-25 DIAGNOSIS — I152 Hypertension secondary to endocrine disorders: Secondary | ICD-10-CM

## 2019-12-25 DIAGNOSIS — K219 Gastro-esophageal reflux disease without esophagitis: Secondary | ICD-10-CM

## 2019-12-25 DIAGNOSIS — E119 Type 2 diabetes mellitus without complications: Secondary | ICD-10-CM

## 2020-01-01 ENCOUNTER — Other Ambulatory Visit: Payer: Self-pay | Admitting: *Deleted

## 2020-01-01 DIAGNOSIS — J4520 Mild intermittent asthma, uncomplicated: Secondary | ICD-10-CM

## 2020-01-01 MED ORDER — ALBUTEROL SULFATE HFA 108 (90 BASE) MCG/ACT IN AERS: INHALATION_SPRAY | RESPIRATORY_TRACT | 3 refills | Status: DC

## 2020-01-04 ENCOUNTER — Other Ambulatory Visit: Payer: Self-pay | Admitting: Nurse Practitioner

## 2020-03-18 ENCOUNTER — Other Ambulatory Visit: Payer: Self-pay | Admitting: Nurse Practitioner

## 2020-03-18 DIAGNOSIS — Z1231 Encounter for screening mammogram for malignant neoplasm of breast: Secondary | ICD-10-CM

## 2020-03-27 ENCOUNTER — Other Ambulatory Visit: Payer: Self-pay | Admitting: Nurse Practitioner

## 2020-04-01 ENCOUNTER — Other Ambulatory Visit: Payer: Self-pay | Admitting: *Deleted

## 2020-04-01 ENCOUNTER — Other Ambulatory Visit: Payer: Self-pay | Admitting: Nurse Practitioner

## 2020-04-01 DIAGNOSIS — J4520 Mild intermittent asthma, uncomplicated: Secondary | ICD-10-CM

## 2020-04-01 MED ORDER — FLUTICASONE-SALMETEROL 100-50 MCG/DOSE IN AEPB: INHALATION_SPRAY | RESPIRATORY_TRACT | 0 refills | Status: DC

## 2020-04-15 ENCOUNTER — Ambulatory Visit (INDEPENDENT_AMBULATORY_CARE_PROVIDER_SITE_OTHER): Payer: BC Managed Care – PPO | Admitting: Nurse Practitioner

## 2020-04-15 ENCOUNTER — Encounter: Payer: Self-pay | Admitting: Nurse Practitioner

## 2020-04-15 DIAGNOSIS — J0100 Acute maxillary sinusitis, unspecified: Secondary | ICD-10-CM

## 2020-04-15 MED ORDER — AMOXICILLIN-POT CLAVULANATE 875-125 MG PO TABS: 1.0000 | ORAL_TABLET | Freq: Two times a day (BID) | ORAL | 0 refills | Status: DC

## 2020-04-15 MED ORDER — BENZONATATE 100 MG PO CAPS: 100.0000 mg | ORAL_CAPSULE | Freq: Three times a day (TID) | ORAL | 0 refills | Status: DC | PRN

## 2020-04-15 NOTE — Progress Notes (Signed)
Virtual Visit via telephone Note Due to COVID-19 pandemic this visit was conducted virtually. This visit type was conducted due to national recommendations for restrictions regarding the COVID-19 Pandemic (e.g. social distancing, sheltering in place) in an effort to limit this patient's exposure and mitigate transmission in our community. All issues noted in this document were discussed and addressed.  A physical exam was not performed with this format.  I connected with Brittany Jenkins on 04/15/20 at 11:42 by telephone and verified that I am speaking with the correct person using two identifiers. Brittany Jenkins is currently located at home and no one is currently with  her during visit. The provider, Mary-Margaret Daphine Deutscher, FNP is located in their office at time of visit.  I discussed the limitations, risks, security and privacy concerns of performing an evaluation and management service by telephone and the availability of in person appointments. I also discussed with the patient that there may be a patient responsible charge related to this service. The patient expressed understanding and agreed to proceed.   History and Present Illness:   Chief Complaint: URI   HPI Patient calls in stating she has been sick for 2 weeks. Developed a fever of 101 with drainage changing to green. She has had a sore throat. She did a home covid test which as negative. She had both of her vaccine. No c/o myalgia or loss of taste or smell.   Review of Systems  Constitutional: Positive for chills, fever and malaise/fatigue. Negative for diaphoresis and weight loss.  HENT: Positive for congestion. Negative for ear pain and sore throat.   Eyes: Negative for blurred vision, double vision and pain.  Respiratory: Positive for cough (productive). Negative for shortness of breath.   Cardiovascular: Negative for chest pain, palpitations, orthopnea and leg swelling.  Gastrointestinal: Negative for abdominal pain.    Skin: Negative for rash.  Neurological: Positive for headaches (comes and goes). Negative for dizziness, sensory change, loss of consciousness and weakness.  Endo/Heme/Allergies: Negative for polydipsia. Does not bruise/bleed easily.  Psychiatric/Behavioral: Negative for memory loss. The patient does not have insomnia.   All other systems reviewed and are negative.    Observations/Objective: Alert and oriented- answers all questions appropriately No distress Deep wet cough Voice hoarse    Assessment and Plan: Brittany Jenkins in today with chief complaint of URI   1. Acute non-recurrent maxillary sinusitis 1. Take meds as prescribed 2. Use a cool mist humidifier especially during the winter months and when heat has been humid. 3. Use saline nose sprays frequently 4. Saline irrigations of the nose can be very helpful if done frequently.  * 4X daily for 1 week*  * Use of a nettie pot can be helpful with this. Follow directions with this* 5. Drink plenty of fluids 6. Keep thermostat turn down low 7.For any cough or congestion  Use plain Mucinex- regular strength or max strength is fine   * Children- consult with Pharmacist for dosing 8. For fever or aces or pains- take tylenol or ibuprofen appropriate for age and weight.  * for fevers greater than 101 orally you may alternate ibuprofen and tylenol every  3 hours.    - benzonatate (TESSALON PERLES) 100 MG capsule; Take 1 capsule (100 mg total) by mouth 3 (three) times daily as needed for cough.  Dispense: 20 capsule; Refill: 0 - amoxicillin-clavulanate (AUGMENTIN) 875-125 MG tablet; Take 1 tablet by mouth 2 (two) times daily.  Dispense: 14 tablet; Refill: 0    Follow  Up Instructions: prn    I discussed the assessment and treatment plan with the patient. The patient was provided an opportunity to ask questions and all were answered. The patient agreed with the plan and demonstrated an understanding of the instructions.    The patient was advised to call back or seek an in-person evaluation if the symptoms worsen or if the condition fails to improve as anticipated.  The above assessment and management plan was discussed with the patient. The patient verbalized understanding of and has agreed to the management plan. Patient is aware to call the clinic if symptoms persist or worsen. Patient is aware when to return to the clinic for a follow-up visit. Patient educated on when it is appropriate to go to the emergency department.   Time call ended:  11:56  I provided 14 minutes of non-face-to-face time during this encounter.    Mary-Margaret Daphine Deutscher, FNP

## 2020-04-16 ENCOUNTER — Ambulatory Visit: Payer: BC Managed Care – PPO

## 2020-04-19 ENCOUNTER — Other Ambulatory Visit: Payer: Self-pay | Admitting: *Deleted

## 2020-04-19 DIAGNOSIS — E119 Type 2 diabetes mellitus without complications: Secondary | ICD-10-CM

## 2020-04-19 MED ORDER — JANUMET 50-1000 MG PO TABS: 1.0000 | ORAL_TABLET | Freq: Two times a day (BID) | ORAL | 0 refills | Status: DC

## 2020-04-24 ENCOUNTER — Other Ambulatory Visit: Payer: Self-pay

## 2020-04-24 ENCOUNTER — Ambulatory Visit
Admission: RE | Admit: 2020-04-24 | Discharge: 2020-04-24 | Disposition: A | Discharge status: Home / Self Care | Payer: BC Managed Care – PPO | Source: Ambulatory Visit | Attending: Nurse Practitioner | Admitting: Nurse Practitioner

## 2020-04-24 DIAGNOSIS — Z1231 Encounter for screening mammogram for malignant neoplasm of breast: Secondary | ICD-10-CM | POA: Diagnosis not present

## 2020-04-30 ENCOUNTER — Other Ambulatory Visit: Payer: Self-pay | Admitting: Nurse Practitioner

## 2020-04-30 DIAGNOSIS — I152 Hypertension secondary to endocrine disorders: Secondary | ICD-10-CM

## 2020-05-03 ENCOUNTER — Ambulatory Visit: Payer: BC Managed Care – PPO | Admitting: Nurse Practitioner

## 2020-07-09 ENCOUNTER — Other Ambulatory Visit: Payer: Self-pay | Admitting: Nurse Practitioner

## 2020-07-09 DIAGNOSIS — J4520 Mild intermittent asthma, uncomplicated: Secondary | ICD-10-CM

## 2020-07-15 ENCOUNTER — Other Ambulatory Visit: Payer: Self-pay | Admitting: Nurse Practitioner

## 2020-07-15 DIAGNOSIS — J4520 Mild intermittent asthma, uncomplicated: Secondary | ICD-10-CM

## 2020-07-30 ENCOUNTER — Other Ambulatory Visit: Payer: Self-pay | Admitting: Nurse Practitioner

## 2020-07-30 DIAGNOSIS — I152 Hypertension secondary to endocrine disorders: Secondary | ICD-10-CM

## 2020-07-30 DIAGNOSIS — E1159 Type 2 diabetes mellitus with other circulatory complications: Secondary | ICD-10-CM

## 2020-08-01 ENCOUNTER — Telehealth: Payer: Self-pay

## 2020-08-01 NOTE — Telephone Encounter (Signed)
NTBS seen to discuss- needs to have hgba1c repeated before we change anything

## 2020-08-01 NOTE — Telephone Encounter (Signed)
Pt is scheduled for 08/08/20 at 12:15. Pt requested to come next week. Had offered an appt for 3/18 and pt was unable to make that.

## 2020-08-01 NOTE — Telephone Encounter (Signed)
Please review and advise.

## 2020-08-03 ENCOUNTER — Other Ambulatory Visit: Payer: Self-pay | Admitting: Nurse Practitioner

## 2020-08-03 DIAGNOSIS — J4520 Mild intermittent asthma, uncomplicated: Secondary | ICD-10-CM

## 2020-08-08 ENCOUNTER — Other Ambulatory Visit: Payer: Self-pay

## 2020-08-08 ENCOUNTER — Encounter: Payer: Self-pay | Admitting: Nurse Practitioner

## 2020-08-08 ENCOUNTER — Ambulatory Visit (INDEPENDENT_AMBULATORY_CARE_PROVIDER_SITE_OTHER): Payer: 59 | Admitting: Nurse Practitioner

## 2020-08-08 ENCOUNTER — Other Ambulatory Visit: Payer: Self-pay | Admitting: Nurse Practitioner

## 2020-08-08 VITALS — BP 133/78 | HR 82 | Temp 99.3°F | Resp 20 | Ht 67.0 in | Wt 153.0 lb

## 2020-08-08 DIAGNOSIS — E119 Type 2 diabetes mellitus without complications: Secondary | ICD-10-CM | POA: Diagnosis not present

## 2020-08-08 DIAGNOSIS — F5101 Primary insomnia: Secondary | ICD-10-CM

## 2020-08-08 DIAGNOSIS — I152 Hypertension secondary to endocrine disorders: Secondary | ICD-10-CM

## 2020-08-08 DIAGNOSIS — K219 Gastro-esophageal reflux disease without esophagitis: Secondary | ICD-10-CM

## 2020-08-08 DIAGNOSIS — J4520 Mild intermittent asthma, uncomplicated: Secondary | ICD-10-CM

## 2020-08-08 DIAGNOSIS — E1159 Type 2 diabetes mellitus with other circulatory complications: Secondary | ICD-10-CM

## 2020-08-08 DIAGNOSIS — E785 Hyperlipidemia, unspecified: Secondary | ICD-10-CM | POA: Diagnosis not present

## 2020-08-08 LAB — BAYER DCA HB A1C WAIVED: HB A1C (BAYER DCA - WAIVED): 8.5 % — ABNORMAL HIGH (ref <7.0)

## 2020-08-08 MED ORDER — EMPAGLIFLOZIN 10 MG PO TABS: 10.0000 mg | ORAL_TABLET | Freq: Every day | ORAL | 3 refills | Status: DC

## 2020-08-08 MED ORDER — LISINOPRIL-HYDROCHLOROTHIAZIDE 20-12.5 MG PO TABS: 1.0000 | ORAL_TABLET | Freq: Every day | ORAL | 0 refills | Status: DC

## 2020-08-08 MED ORDER — METFORMIN HCL 1000 MG PO TABS: 1000.0000 mg | ORAL_TABLET | Freq: Two times a day (BID) | ORAL | 3 refills | Status: DC

## 2020-08-08 MED ORDER — GLIPIZIDE 10 MG PO TABS: 10.0000 mg | ORAL_TABLET | Freq: Two times a day (BID) | ORAL | 1 refills | Status: DC

## 2020-08-08 MED ORDER — ATORVASTATIN CALCIUM 20 MG PO TABS: 20.0000 mg | ORAL_TABLET | Freq: Every day | ORAL | 1 refills | Status: DC

## 2020-08-08 MED ORDER — OMEPRAZOLE 20 MG PO CPDR: 20.0000 mg | DELAYED_RELEASE_CAPSULE | Freq: Two times a day (BID) | ORAL | 5 refills | Status: DC

## 2020-08-08 NOTE — Progress Notes (Signed)
Subjective:    Patient ID: Brittany Jenkins, female    DOB: Oct 03, 1965, 55 y.o.   MRN: [id number]   Chief Complaint: Medical Management of Chronic Issues    HPI:  1. Type 2 diabetes mellitus without complication, without long-term current use of insulin ([facility]) Currently taking glipizide, janument. Has not had a follow up appt since 11/2019. She saw Ulice Dash, another provider in the office. Her A1c was elevated at 9.0% at that time, no changes were made.   Her insurance is not going to cover janument, so she will need to change it. She is willing to try metformin again. Her insurance may pay for metformin and sitagliptin separately if it is available.    She does check FBG at home. Runs about 100-115 in the AM.   Lab Results  Component Value Date   HGBA1C 9.0 (H) 11/23/2019    2. Hypertension associated with type 2 diabetes mellitus ([place]) Takes lisinopril/hctz with no side effects. No HA, SOB, CP. Does check BP at home. Runs in the 130's at home.   3. Hyperlipidemia, unspecified hyperlipidemia type Controlled with atorvastatin. No adverse effects. Does try and follow a low fat diet.   Lab Results  Component Value Date   CHOL 174 11/23/2019   HDL 33 (L) 11/23/2019   LDLCALC 89 11/23/2019   TRIG 311 (H) 11/23/2019   CHOLHDL 5.3 (H) 11/23/2019    4. Mild intermittent asthma without complication Takes low dose advair. Does use PRN duoneb. Uses it 2 times per month. She believes her symptoms are well managed. Takes OTC benadryl and Claritin to manage allergy symptoms.     5. Gastroesophageal reflux disease without esophagitis Taking omeprazole. Avoiding spicy foods. Does well with this.     Outpatient Encounter Medications as of 08/08/2020  Medication Sig  . albuterol (VENTOLIN HFA) 108 (90 Base) MCG/ACT inhaler INHALE 2 PUFFS BY MOUTH EVERY 4 TO 6 HOURS AS A RESCUE FOR ASTHMA (NTBS FOR REFILL)  . amoxicillin-clavulanate (AUGMENTIN) 875-125 MG tablet Take 1 tablet by mouth 2 (two)  times daily.  Marland Kitchen atorvastatin (LIPITOR) 20 MG tablet Take 1 tablet (20 mg total) by mouth daily.  . benzonatate (TESSALON PERLES) 100 MG capsule Take 1 capsule (100 mg total) by mouth 3 (three) times daily as needed for cough.  . clotrimazole (MYCELEX) 10 MG troche DISSOLVE 1 TROCHE (10 MG TOTAL) IN MOUTH 5 (FIVE) TIMES DAILY.  . diphenhydrAMINE (BENADRYL) 25 mg capsule Take 25 mg by mouth every other day.  . Fluticasone-Salmeterol (ADVAIR DISKUS) 100-50 MCG/DOSE AEPB INHALE 1 PUFF BY MOUTH INTO THE LUNGS TWICE A DAY  . glipiZIDE (GLUCOTROL) 10 MG tablet Take 1 tablet (10 mg total) by mouth 2 (two) times daily before a meal. (NEEDS TO BE SEEN BEFORE NEXT REFILL)  . ipratropium-albuterol (DUONEB) 0.5-2.5 (3) MG/3ML SOLN Take 3 mLs by nebulization every 6 (six) hours as needed.  Marland Kitchen lisinopril-hydrochlorothiazide (ZESTORETIC) 20-12.5 MG tablet Take 1 tablet by mouth daily. (NEEDS TO BE SEEN BEFORE NEXT REFILL)  . loratadine (CLARITIN) 10 MG tablet Take 1 tablet (10 mg total) by mouth every other day.  Marland Kitchen omeprazole (PRILOSEC) 20 MG capsule TAKE 1 CAPSULE (20 MG TOTAL) BY MOUTH 2 (TWO) TIMES DAILY BEFORE A MEAL.  Marland Kitchen sitaGLIPtin-metformin (JANUMET) 50-1000 MG tablet Take 1 tablet by mouth 2 (two) times daily with a meal.  . triamcinolone ointment (KENALOG) 0.5 % APPLY TO AFFECTED AREA TWICE A DAY   No facility-administered encounter medications on file as of 08/08/2020.  Past Surgical History:  Procedure Laterality Date  . HERNIA REPAIR      Family History  Problem Relation Age of Onset  . Arthritis Mother   . Asthma Mother   . Diabetes Mother   . Hypertension Mother   . Hyperlipidemia Mother   . Arthritis Father   . Diabetes Father   . Hypertension Father   . Hyperlipidemia Father   . GER disease Father     New complaints: Trouble sleeping since her mother died. Increased anxiety at night.   Social history: Lives with father.   Controlled substance contract: N/A     Review of  Systems  Constitutional: Negative for chills, fatigue and fever.  Respiratory: Negative for cough, shortness of breath and wheezing.   Cardiovascular: Negative for chest pain and palpitations.  Gastrointestinal: Negative for abdominal distention, constipation and diarrhea.  Genitourinary: Negative for difficulty urinating, dysuria and hematuria.  Neurological: Negative for dizziness, light-headedness and numbness.  Psychiatric/Behavioral: Negative for confusion. The patient is not nervous/anxious.        Objective:   Physical Exam Cardiovascular:     Rate and Rhythm: Normal rate and regular rhythm.     Pulses: Normal pulses.     Heart sounds: Normal heart sounds.  Pulmonary:     Effort: Pulmonary effort is normal.     Breath sounds: Normal breath sounds.  Abdominal:     General: Abdomen is flat. Bowel sounds are normal.     Palpations: Abdomen is soft.  Musculoskeletal:        General: Normal range of motion.     Cervical back: Normal range of motion and neck supple.  Skin:    General: Skin is warm and dry.     Capillary Refill: Capillary refill takes less than 2 seconds.  Neurological:     Mental Status: She is alert and oriented to person, place, and time.  Psychiatric:        Mood and Affect: Mood normal.        Behavior: Behavior normal.        Thought Content: Thought content normal.        Judgment: Judgment normal.     Vitals:   08/08/20 1238  BP: 133/78  Pulse: 82  Resp: 20  Temp: 99.3 F (37.4 C)  SpO2: 96%      Assessment & Plan:   Brittany Jenkins comes in today with chief complaint of Medical Management of Chronic Issues   Diagnosis and orders addressed:  1. Type 2 diabetes mellitus without complication, without long-term current use of insulin ([place]) Begin Jardiance. Samples given for the next 30 days. Insurance would not pay for Janumet. She will contact the office with information on her insurance and what they will cover. We discussed diet and  exercise. She will contact the office prior to running out of her current supply of jardiance.  Continue metformin and glipizide  - Bayer DCA Hb A1c Waived - Microalbumin / creatinine urine ratio - glipiZIDE (GLUCOTROL) 10 MG tablet; Take 1 tablet (10 mg total) by mouth 2 (two) times daily before a meal. (NEEDS TO BE SEEN BEFORE NEXT REFILL)  Dispense: 180 tablet; Refill: 1 - metFORMIN (GLUCOPHAGE) 1000 MG tablet; Take 1 tablet (1,000 mg total) by mouth 2 (two) times daily with a meal.  Dispense: 180 tablet; Refill: 3 - empagliflozin (JARDIANCE) 10 MG TABS tablet; Take 1 tablet (10 mg total) by mouth daily before breakfast.  Dispense: 30 tablet; Refill: 3  2.  Hypertension associated with type 2 diabetes mellitus ([place]) Continue mediations as prescribed and monitoring BP at home.   - CBC with Differential/Platelet - CMP14+EGFR - lisinopril-hydrochlorothiazide (ZESTORETIC) 20-12.5 MG tablet; Take 1 tablet by mouth daily. (NEEDS TO BE SEEN BEFORE NEXT REFILL)  Dispense: 30 tablet; Refill: 0  3. Hyperlipidemia, unspecified hyperlipidemia type [place] medication as prescribed. Low fat diet and exercise.  - Lipid panel - atorvastatin (LIPITOR) 20 MG tablet; Take 1 tablet (20 mg total) by mouth daily.  Dispense: 90 tablet; Refill: 1  4. Mild intermittent asthma without complication Continue medications as prescribed. We discussed taking Claritin OTC qd vs PRN to control allergy symptoms since they are triggers for asthma exacerbations for her. We discussed Singulair as a possibility if Claritin does not relieve or control her symptoms.   5. Gastroesophageal reflux disease without esophagitis Continue medications as prescribed. Avoid spicy foods and eating late at night.  - omeprazole (PRILOSEC) 20 MG capsule; Take 1 capsule (20 mg total) by mouth 2 (two) times daily before a meal.  Dispense: 60 capsule; Refill: 5  6. Insomnia, primary Discussed trying OTC melatonin supplement before adding any  new medications.    Labs pending Health Maintenance reviewed Diet and exercise encouraged  Follow up plan: 3 months  Dollene Primrose, RN, BSN, FNP-Student  Mary-Margaret Hassell Done, FNP

## 2020-08-09 LAB — CBC WITH DIFFERENTIAL/PLATELET
Basophils Absolute: 0.1 10*3/uL (ref 0.0–0.2)
Basos: 2 %
EOS (ABSOLUTE): 0.5 10*3/uL — ABNORMAL HIGH (ref 0.0–0.4)
Eos: 6 %
Hematocrit: 37.1 % (ref 34.0–46.6)
Hemoglobin: 11.7 g/dL (ref 11.1–15.9)
Immature Grans (Abs): 0 10*3/uL (ref 0.0–0.1)
Immature Granulocytes: 1 %
Lymphocytes Absolute: 2.3 10*3/uL (ref 0.7–3.1)
Lymphs: 29 %
MCH: 25.7 pg — ABNORMAL LOW (ref 26.6–33.0)
MCHC: 31.5 g/dL (ref 31.5–35.7)
MCV: 81 fL (ref 79–97)
Monocytes Absolute: 0.5 10*3/uL (ref 0.1–0.9)
Monocytes: 7 %
Neutrophils Absolute: 4.6 10*3/uL (ref 1.4–7.0)
Neutrophils: 55 %
Platelets: 526 10*3/uL — ABNORMAL HIGH (ref 150–450)
RBC: 4.56 x10E6/uL (ref 3.77–5.28)
RDW: 15.6 % — ABNORMAL HIGH (ref 11.7–15.4)
WBC: 8.1 10*3/uL (ref 3.4–10.8)

## 2020-08-09 LAB — CMP14+EGFR
ALT: 18 IU/L (ref 0–32)
AST: 15 IU/L (ref 0–40)
Albumin/Globulin Ratio: 1.5 (ref 1.2–2.2)
Albumin: 4.3 g/dL (ref 3.8–4.9)
Alkaline Phosphatase: 71 IU/L (ref 44–121)
BUN/Creatinine Ratio: 15 (ref 9–23)
BUN: 10 mg/dL (ref 6–24)
Bilirubin Total: 0.3 mg/dL (ref 0.0–1.2)
CO2: 23 mmol/L (ref 20–29)
Calcium: 10.1 mg/dL (ref 8.7–10.2)
Chloride: 102 mmol/L (ref 96–106)
Creatinine, Ser: 0.66 mg/dL (ref 0.57–1.00)
Globulin, Total: 2.9 g/dL (ref 1.5–4.5)
Glucose: 117 mg/dL — ABNORMAL HIGH (ref 65–99)
Potassium: 4.2 mmol/L (ref 3.5–5.2)
Sodium: 143 mmol/L (ref 134–144)
Total Protein: 7.2 g/dL (ref 6.0–8.5)
eGFR: 103 mL/min/{1.73_m2} (ref >59)

## 2020-08-09 LAB — LIPID PANEL
Chol/HDL Ratio: 4.3 ratio (ref 0.0–4.4)
Cholesterol, Total: 189 mg/dL (ref 100–199)
HDL: 44 mg/dL (ref >39)
LDL Chol Calc (NIH): 113 mg/dL — ABNORMAL HIGH (ref 0–99)
Triglycerides: 184 mg/dL — ABNORMAL HIGH (ref 0–149)
VLDL Cholesterol Cal: 32 mg/dL (ref 5–40)

## 2020-08-09 LAB — MICROALBUMIN / CREATININE URINE RATIO
Creatinine, Urine: 43.4 mg/dL
Microalb/Creat Ratio: <7 mg/g creat (ref 0–29)
Microalbumin, Urine: <3 ug/mL

## 2020-08-23 ENCOUNTER — Other Ambulatory Visit: Payer: Self-pay | Admitting: Nurse Practitioner

## 2020-08-23 DIAGNOSIS — J4520 Mild intermittent asthma, uncomplicated: Secondary | ICD-10-CM

## 2020-08-27 ENCOUNTER — Other Ambulatory Visit: Payer: Self-pay | Admitting: Nurse Practitioner

## 2020-08-27 DIAGNOSIS — I152 Hypertension secondary to endocrine disorders: Secondary | ICD-10-CM

## 2020-08-27 DIAGNOSIS — E1159 Type 2 diabetes mellitus with other circulatory complications: Secondary | ICD-10-CM

## 2020-09-12 ENCOUNTER — Ambulatory Visit: Payer: 59 | Admitting: Pharmacist

## 2020-09-19 ENCOUNTER — Other Ambulatory Visit: Payer: Self-pay

## 2020-09-19 ENCOUNTER — Ambulatory Visit (INDEPENDENT_AMBULATORY_CARE_PROVIDER_SITE_OTHER): Payer: 59 | Admitting: Pharmacist

## 2020-09-19 VITALS — BP 94/50

## 2020-09-19 DIAGNOSIS — E119 Type 2 diabetes mellitus without complications: Secondary | ICD-10-CM | POA: Diagnosis not present

## 2020-09-19 MED ORDER — LISINOPRIL-HYDROCHLOROTHIAZIDE 10-12.5 MG PO TABS: 1.0000 | ORAL_TABLET | Freq: Every day | ORAL | 3 refills | Status: DC

## 2020-09-19 MED ORDER — NYSTATIN-TRIAMCINOLONE 100000-0.1 UNIT/GM-% EX CREA: 1.0000 "application " | TOPICAL_CREAM | Freq: Two times a day (BID) | CUTANEOUS | 0 refills | Status: DC | PRN

## 2020-09-19 MED ORDER — FAMOTIDINE 20 MG PO TABS: 20.0000 mg | ORAL_TABLET | Freq: Two times a day (BID) | ORAL | 2 refills | Status: DC

## 2020-09-19 NOTE — Progress Notes (Signed)
    09/19/2020 Name: Brittany Jenkins MRN: [id number] DOB: 09/25/1965   S:  55 yoF Presents for diabetes evaluation, education, and management Patient was referred and last seen by Primary Care Provider on 08/08/20. Insurance coverage/medication affordability: [employer]  Patient reports adherence with medications. . Current diabetes medications include: janumet,  glipizide . Current hypertension medications include: lisinopril/hctz Goal 130/80 . Current hyperlipidemia medications include: atorvastatin  Patient denies hypoglycemic events.   Patient reported dietary habits: Eats 3 Discussed meal planning options and Plate method for healthy eating . Avoid sugary drinks and desserts . Incorporate balanced protein, non starchy veggies, 1 serving of carbohydrate with each meal . Increase water intake . Increase physical activity as able  meals/day   O:  Lab Results  Component Value Date   HGBA1C 8.5 (H) 08/08/2020    There were no vitals filed for this visit.     Lipid Panel     Component Value Date/Time   CHOL 189 08/08/2020 1240   TRIG 184 (H) 08/08/2020 1240   TRIG 136 06/08/2013 1204   HDL 44 08/08/2020 1240   HDL 54 06/08/2013 1204   CHOLHDL 4.3 08/08/2020 1240   LDLCALC 113 (H) 08/08/2020 1240   LDLCALC 141 (H) 06/08/2013 1204    Home fasting blood sugars: <180  2 hour post-meal/random blood sugars: n/a.    Clinical Atherosclerotic Cardiovascular Disease (ASCVD): No   The 10-year ASCVD risk score Denman George DC Jr., et al., 2013) is: 7%   Values used to calculate the score:     Age: 55 years     Sex: Female     Is Non-Hispanic African American: No     Diabetic: Yes     Tobacco smoker: No     Systolic Blood Pressure: 133 mmHg     Is BP treated: Yes     HDL Cholesterol: 44 mg/dL     Total Cholesterol: 189 mg/dL    A/P:  Diabetes Z6SA currently uncontrolled.  Patient is adherent with medication. Control is suboptimal due to diet/lifestyle.  -Continue  Janumet 50/1000mg  twice daily  Patient would like to stick with this regimen--cost has been an issue (patient is currently out of supply)--we have no samples  Paperwork filled out for [employer] patient assistance   Recommended she try metformin XR in the meantime to attempt to avoid GI side effects; patient to let me know  -Extensively discussed pathophysiology of diabetes, recommended lifestyle interventions, dietary effects on blood sugar control  -Counseled on s/sx of and management of hypoglycemia  -Next A1C anticipated 11/11/20  Written patient instructions provided.  Total time in face to face counseling 25 minutes.   Follow up PCP Clinic Visit ON 11/11/20.   Kieth Brightly, PharmD, BCPS Clinical Pharmacist, [facility]  II Phone [phone]

## 2020-09-25 ENCOUNTER — Telehealth: Payer: Self-pay

## 2020-09-25 NOTE — Telephone Encounter (Signed)
No samples of the Janumet yet Awaiting [employer] rep Will let patient know when some arrive

## 2020-10-01 ENCOUNTER — Telehealth: Payer: Self-pay | Admitting: Nurse Practitioner

## 2020-10-01 NOTE — Telephone Encounter (Signed)
Assisted patient with [employer] application via phone Janumet should arrive to patient's home in 2-3 weeks

## 2020-10-01 NOTE — Telephone Encounter (Signed)
Pt received a letter about medications and was told to call Raynelle Fanning when she did. Please call back.

## 2020-10-22 ENCOUNTER — Telehealth: Payer: Self-pay | Admitting: Nurse Practitioner

## 2020-10-22 NOTE — Telephone Encounter (Signed)
TC to pt [facility] & they are getting it ready for her

## 2020-10-22 NOTE — Telephone Encounter (Signed)
  Prescription Request  10/22/2020  What is the name of the medication or equipment? lipitor  Have you contacted your pharmacy to request a refill? (if applicable) YES  Which pharmacy would you like this sent to? [facility] [place]    Patient notified that their request is being sent to the clinical staff for review and that they should receive a response within 2 business days.

## 2020-10-31 ENCOUNTER — Other Ambulatory Visit: Payer: Self-pay | Admitting: *Deleted

## 2020-10-31 DIAGNOSIS — E785 Hyperlipidemia, unspecified: Secondary | ICD-10-CM

## 2020-10-31 MED ORDER — ATORVASTATIN CALCIUM 20 MG PO TABS: 20.0000 mg | ORAL_TABLET | Freq: Every day | ORAL | 1 refills | Status: DC

## 2020-11-11 ENCOUNTER — Ambulatory Visit: Payer: Self-pay | Admitting: Nurse Practitioner

## 2020-11-20 ENCOUNTER — Other Ambulatory Visit: Payer: Self-pay | Admitting: Family Medicine

## 2020-11-20 MED ORDER — FLUTICASONE-SALMETEROL 250-50 MCG/ACT IN AEPB: 1.0000 | INHALATION_SPRAY | Freq: Two times a day (BID) | RESPIRATORY_TRACT | 11 refills | Status: DC

## 2020-11-23 ENCOUNTER — Other Ambulatory Visit: Payer: Self-pay | Admitting: Nurse Practitioner

## 2020-11-23 DIAGNOSIS — E1159 Type 2 diabetes mellitus with other circulatory complications: Secondary | ICD-10-CM

## 2020-11-23 DIAGNOSIS — I152 Hypertension secondary to endocrine disorders: Secondary | ICD-10-CM

## 2020-12-31 ENCOUNTER — Ambulatory Visit: Payer: Self-pay | Admitting: Nurse Practitioner

## 2021-01-14 ENCOUNTER — Other Ambulatory Visit: Payer: Self-pay | Admitting: *Deleted

## 2021-01-14 MED ORDER — TRIAMCINOLONE ACETONIDE 0.5 % EX OINT: TOPICAL_OINTMENT | CUTANEOUS | 0 refills | Status: DC

## 2021-02-07 ENCOUNTER — Other Ambulatory Visit: Payer: Self-pay | Admitting: Nurse Practitioner

## 2021-02-07 DIAGNOSIS — J4520 Mild intermittent asthma, uncomplicated: Secondary | ICD-10-CM

## 2021-02-28 ENCOUNTER — Other Ambulatory Visit: Payer: Self-pay | Admitting: Nurse Practitioner

## 2021-02-28 DIAGNOSIS — E119 Type 2 diabetes mellitus without complications: Secondary | ICD-10-CM

## 2021-03-25 ENCOUNTER — Other Ambulatory Visit: Payer: Self-pay | Admitting: Nurse Practitioner

## 2021-03-25 DIAGNOSIS — J4520 Mild intermittent asthma, uncomplicated: Secondary | ICD-10-CM

## 2021-04-04 ENCOUNTER — Other Ambulatory Visit: Payer: Self-pay | Admitting: Nurse Practitioner

## 2021-04-04 DIAGNOSIS — E119 Type 2 diabetes mellitus without complications: Secondary | ICD-10-CM

## 2021-04-13 ENCOUNTER — Other Ambulatory Visit: Payer: Self-pay | Admitting: Nurse Practitioner

## 2021-04-13 DIAGNOSIS — K219 Gastro-esophageal reflux disease without esophagitis: Secondary | ICD-10-CM

## 2021-04-16 ENCOUNTER — Encounter: Payer: Self-pay | Admitting: Nurse Practitioner

## 2021-04-16 ENCOUNTER — Other Ambulatory Visit: Payer: Self-pay | Admitting: Nurse Practitioner

## 2021-04-16 DIAGNOSIS — J4520 Mild intermittent asthma, uncomplicated: Secondary | ICD-10-CM

## 2021-04-16 NOTE — Telephone Encounter (Signed)
MMM NTBS 30 days given 03/25/21

## 2021-04-16 NOTE — Telephone Encounter (Signed)
Left message for pt to make appt to get med refill

## 2021-04-30 ENCOUNTER — Other Ambulatory Visit: Payer: Self-pay | Admitting: Nurse Practitioner

## 2021-04-30 DIAGNOSIS — K219 Gastro-esophageal reflux disease without esophagitis: Secondary | ICD-10-CM

## 2021-05-06 ENCOUNTER — Other Ambulatory Visit: Payer: Self-pay | Admitting: Nurse Practitioner

## 2021-05-06 DIAGNOSIS — J4520 Mild intermittent asthma, uncomplicated: Secondary | ICD-10-CM

## 2021-05-06 DIAGNOSIS — E119 Type 2 diabetes mellitus without complications: Secondary | ICD-10-CM

## 2021-05-16 ENCOUNTER — Other Ambulatory Visit: Payer: Self-pay | Admitting: Nurse Practitioner

## 2021-05-16 DIAGNOSIS — Z1231 Encounter for screening mammogram for malignant neoplasm of breast: Secondary | ICD-10-CM

## 2021-05-28 ENCOUNTER — Encounter: Payer: Self-pay | Admitting: Family Medicine

## 2021-05-28 ENCOUNTER — Other Ambulatory Visit: Payer: Self-pay | Admitting: Nurse Practitioner

## 2021-05-28 ENCOUNTER — Ambulatory Visit (INDEPENDENT_AMBULATORY_CARE_PROVIDER_SITE_OTHER): Payer: 59 | Admitting: Family Medicine

## 2021-05-28 DIAGNOSIS — J4520 Mild intermittent asthma, uncomplicated: Secondary | ICD-10-CM

## 2021-05-28 DIAGNOSIS — J0100 Acute maxillary sinusitis, unspecified: Secondary | ICD-10-CM

## 2021-05-28 MED ORDER — PSEUDOEPHEDRINE-GUAIFENESIN ER 120-1200 MG PO TB12: 1.0000 | ORAL_TABLET | Freq: Two times a day (BID) | ORAL | 0 refills | Status: DC

## 2021-05-28 MED ORDER — AMOXICILLIN-POT CLAVULANATE 875-125 MG PO TABS: 1.0000 | ORAL_TABLET | Freq: Two times a day (BID) | ORAL | 0 refills | Status: DC

## 2021-05-28 NOTE — Progress Notes (Signed)
Subjective:    Patient ID: Brittany Jenkins, female    DOB: [birth year], 56 y.o.   MRN: [id number]   HPI: [name] is a 56 y.o. female presenting for a week of scratchy throat. Fever first two days. Sinus congestion. Eyes red, itching. Now  green and yellow rhinorrhea and phlegm. Not much cough. Has asthma, no dyspnea yet. Nose congested.   Depression screen [facility] 2/9 08/08/2020 11/23/2019 07/25/2018 05/16/2018 10/04/2017  Decreased Interest 0 0 0 0 0  Down, Depressed, Hopeless 0 0 0 0 0  PHQ - 2 Score 0 0 0 0 0     Relevant past medical, surgical, family and social history reviewed and updated as indicated.  Interim medical history since our last visit reviewed. Allergies and medications reviewed and updated.  ROS:  Review of Systems  Constitutional:  Negative for activity change, appetite change, chills and fever.  HENT:  Positive for congestion, postnasal drip, rhinorrhea, sinus pressure and sore throat. Negative for ear discharge, ear pain, hearing loss, nosebleeds, sneezing and trouble swallowing.   Respiratory:  Positive for cough. Negative for chest tightness and shortness of breath.   Cardiovascular:  Negative for chest pain and palpitations.  Skin:  Negative for rash.    Social History   Tobacco Use  Smoking Status Former   Types: Cigarettes   Quit date: 05/18/1998   Years since quitting: 23.0  Smokeless Tobacco Never       Objective:     Wt Readings from Last 3 Encounters:  08/08/20 153 lb (69.4 kg)  11/23/19 157 lb 12.8 oz (71.6 kg)  07/25/18 160 lb (72.6 kg)     Exam deferred. Pt. Harboring due to COVID 19. Phone visit performed.   Assessment & Plan:   1. Acute maxillary sinusitis, recurrence not specified   2. Mild intermittent asthma without complication     Meds ordered this encounter  Medications   amoxicillin-clavulanate (AUGMENTIN) 875-125 MG tablet    Sig: Take 1 tablet by mouth 2 (two) times daily. Take all of this medication     Dispense:  20 tablet    Refill:  0   Pseudoephedrine-Guaifenesin [phone] MG TB12    Sig: Take 1 tablet by mouth 2 (two) times daily. For congestion    Dispense:  12 tablet    Refill:  0    No orders of the defined types were placed in this encounter.     Diagnoses and all orders for this visit:  Acute maxillary sinusitis, recurrence not specified  Mild intermittent asthma without complication  Other orders -     amoxicillin-clavulanate (AUGMENTIN) 875-125 MG tablet; Take 1 tablet by mouth 2 (two) times daily. Take all of this medication -     Pseudoephedrine-Guaifenesin [phone] MG TB12; Take 1 tablet by mouth 2 (two) times daily. For congestion    Virtual Visit via telephone Note  I discussed the limitations, risks, security and privacy concerns of performing an evaluation and management service by telephone and the availability of in person appointments. The patient was identified with two identifiers. Pt.expressed understanding and agreed to proceed. Pt. Is at home. Dr. Darlyn Read is in his office.  Follow Up Instructions:   I discussed the assessment and treatment plan with the patient. The patient was provided an opportunity to ask questions and all were answered. The patient agreed with the plan and demonstrated an understanding of the instructions.   The patient was advised to call back or seek an in-person evaluation if  the symptoms worsen or if the condition fails to improve as anticipated.   Total minutes including chart review and phone contact time: 8   Follow up plan: Return if symptoms worsen or fail to improve.  Mechele Claude, MD [facility]

## 2021-05-29 ENCOUNTER — Other Ambulatory Visit: Payer: Self-pay | Admitting: Nurse Practitioner

## 2021-05-29 DIAGNOSIS — E119 Type 2 diabetes mellitus without complications: Secondary | ICD-10-CM

## 2021-06-03 ENCOUNTER — Encounter: Payer: Self-pay | Admitting: Nurse Practitioner

## 2021-06-03 ENCOUNTER — Encounter: Payer: 59 | Admitting: Nurse Practitioner

## 2021-06-03 NOTE — Progress Notes (Signed)
Erroneous

## 2021-06-10 LAB — HM DIABETES EYE EXAM

## 2021-06-13 ENCOUNTER — Other Ambulatory Visit: Payer: Self-pay | Admitting: Nurse Practitioner

## 2021-06-13 DIAGNOSIS — K219 Gastro-esophageal reflux disease without esophagitis: Secondary | ICD-10-CM

## 2021-06-16 ENCOUNTER — Encounter: Payer: Self-pay | Admitting: Nurse Practitioner

## 2021-06-16 ENCOUNTER — Ambulatory Visit
Admission: RE | Admit: 2021-06-16 | Discharge: 2021-06-16 | Disposition: A | Discharge status: Home / Self Care | Payer: 59 | Source: Ambulatory Visit | Attending: Nurse Practitioner | Admitting: Nurse Practitioner

## 2021-06-16 ENCOUNTER — Ambulatory Visit (INDEPENDENT_AMBULATORY_CARE_PROVIDER_SITE_OTHER): Payer: 59 | Admitting: Nurse Practitioner

## 2021-06-16 VITALS — BP 148/83 | HR 89 | Temp 98.0°F | Resp 20 | Ht 67.0 in | Wt 153.0 lb

## 2021-06-16 DIAGNOSIS — E1169 Type 2 diabetes mellitus with other specified complication: Secondary | ICD-10-CM

## 2021-06-16 DIAGNOSIS — K219 Gastro-esophageal reflux disease without esophagitis: Secondary | ICD-10-CM | POA: Diagnosis not present

## 2021-06-16 DIAGNOSIS — E119 Type 2 diabetes mellitus without complications: Secondary | ICD-10-CM

## 2021-06-16 DIAGNOSIS — Z1212 Encounter for screening for malignant neoplasm of rectum: Secondary | ICD-10-CM

## 2021-06-16 DIAGNOSIS — J4520 Mild intermittent asthma, uncomplicated: Secondary | ICD-10-CM | POA: Diagnosis not present

## 2021-06-16 DIAGNOSIS — E1159 Type 2 diabetes mellitus with other circulatory complications: Secondary | ICD-10-CM | POA: Diagnosis not present

## 2021-06-16 DIAGNOSIS — Z1211 Encounter for screening for malignant neoplasm of colon: Secondary | ICD-10-CM | POA: Diagnosis not present

## 2021-06-16 DIAGNOSIS — Z1231 Encounter for screening mammogram for malignant neoplasm of breast: Secondary | ICD-10-CM | POA: Diagnosis not present

## 2021-06-16 DIAGNOSIS — I152 Hypertension secondary to endocrine disorders: Secondary | ICD-10-CM

## 2021-06-16 DIAGNOSIS — E785 Hyperlipidemia, unspecified: Secondary | ICD-10-CM

## 2021-06-16 MED ORDER — OMEPRAZOLE 20 MG PO CPDR: 20.0000 mg | DELAYED_RELEASE_CAPSULE | Freq: Two times a day (BID) | ORAL | 1 refills | Status: AC

## 2021-06-16 MED ORDER — ATORVASTATIN CALCIUM 20 MG PO TABS: 20.0000 mg | ORAL_TABLET | Freq: Every day | ORAL | 1 refills | Status: DC

## 2021-06-16 MED ORDER — FLUTICASONE-SALMETEROL 250-50 MCG/ACT IN AEPB: 1.0000 | INHALATION_SPRAY | Freq: Two times a day (BID) | RESPIRATORY_TRACT | 11 refills | Status: DC

## 2021-06-16 MED ORDER — GLIPIZIDE 10 MG PO TABS: 10.0000 mg | ORAL_TABLET | Freq: Two times a day (BID) | ORAL | 1 refills | Status: DC

## 2021-06-16 MED ORDER — LISINOPRIL-HYDROCHLOROTHIAZIDE 10-12.5 MG PO TABS: 1.0000 | ORAL_TABLET | Freq: Every day | ORAL | 1 refills | Status: DC

## 2021-06-16 MED ORDER — JANUMET 50-1000 MG PO TABS: 1.0000 | ORAL_TABLET | Freq: Two times a day (BID) | ORAL | 1 refills | Status: DC

## 2021-06-16 MED ORDER — FAMOTIDINE 20 MG PO TABS: 20.0000 mg | ORAL_TABLET | Freq: Two times a day (BID) | ORAL | 2 refills | Status: DC

## 2021-06-16 NOTE — Patient Instructions (Signed)

## 2021-06-16 NOTE — Progress Notes (Signed)
Subjective:    Patient ID: Brittany Jenkins, female    DOB: [birth year], 56 y.o.   MRN: [id number]   Chief Complaint: Medical Management of Chronic Issues    HPI:  Brittany Jenkins is a 56 y.o. who identifies as a female who was assigned female at birth.   Social history: Lives with: husband Work history: school system   Comes in today for follow up of the following chronic medical issues:  1. Type 2 diabetes mellitus without complication, without long-term current use of insulin (HCC) Fasting blood sugars have been running around 110-150 She was started on jardiance at last visit and was told to continue metformin and glipizide. We had change her to janumet due to insurance. Lab Results  Component Value Date   HGBA1C 8.5 (H) 08/08/2020    2. Hyperlipidemia, unspecified hyperlipidemia type Does try to watch diet but does not do much exercise. Is on lipitor daily but cannot tolerate it everyday. Has myalgia Lab Results  Component Value Date   CHOL 189 08/08/2020   HDL 44 08/08/2020   LDLCALC 113 (H) 08/08/2020   TRIG 184 (H) 08/08/2020   CHOLHDL 4.3 08/08/2020   The 10-year ASCVD risk score (Arnett DK, et al., 2019) is: 9.4%    3. Hypertension associated with type 2 diabetes mellitus ([place]) No c/o chest pain, sob or headache. Does not check blood pressure at home. BP Readings from Last 3 Encounters:  06/16/21 (!) 148/83  09/19/20 (!) 94/50  08/08/20 133/78     4. Hyperlipidemia associated with type 2 diabetes mellitus ([place]) See above  5. Mild intermittent asthma without complication Is on advair daily and uses albuterol very seldom.  6. Gastroesophageal reflux disease without esophagitis Is on pepcid daily and that works well for her symptoms.   New complaints: None today  Allergies  Allergen Reactions   Erythromycin Nausea And Vomiting    Severe vomiting   Victoza [Liraglutide] Diarrhea and Nausea Only   Outpatient Encounter Medications as of  06/16/2021  Medication Sig   albuterol (VENTOLIN HFA) 108 (90 Base) MCG/ACT inhaler USE 2 PUFFS EVERY 4 TO 6 HOURS AS A RESCUE FOR ASTHMA   amoxicillin-clavulanate (AUGMENTIN) 875-125 MG tablet Take 1 tablet by mouth 2 (two) times daily. Take all of this medication   aspirin 81 MG chewable tablet Chew by mouth daily.   atorvastatin (LIPITOR) 20 MG tablet Take 1 tablet (20 mg total) by mouth daily.   CALCIUM PO Take by mouth.   clotrimazole (MYCELEX) 10 MG troche DISSOLVE 1 TROCHE (10 MG TOTAL) IN MOUTH 5 (FIVE) TIMES DAILY.   diphenhydrAMINE (BENADRYL) 25 mg capsule Take 25 mg by mouth every other day.   famotidine (PEPCID) 20 MG tablet Take 1 tablet (20 mg total) by mouth 2 (two) times daily.   fluticasone-salmeterol (ADVAIR DISKUS) 250-50 MCG/ACT AEPB Inhale 1 puff into the lungs in the morning and at bedtime.   glipiZIDE (GLUCOTROL) 10 MG tablet Take 1 tablet (10 mg total) by mouth 2 (two) times daily before a meal.   ipratropium-albuterol (DUONEB) 0.5-2.5 (3) MG/3ML SOLN Take 3 mLs by nebulization every 6 (six) hours as needed.   lisinopril-hydrochlorothiazide (ZESTORETIC) 10-12.5 MG tablet Take 1 tablet by mouth daily.   metFORMIN (GLUCOPHAGE) 1000 MG tablet Take 1 tablet (1,000 mg total) by mouth 2 (two) times daily with a meal.   nystatin-triamcinolone (MYCOLOG II) cream Apply 1 application topically 2 (two) times daily as needed.   Omega-3 Fatty Acids (FISH OIL) 1000 MG  CAPS Take by mouth.   omeprazole (PRILOSEC) 20 MG capsule Take 1 capsule (20 mg total) by mouth 2 (two) times daily before a meal.   Pseudoephedrine-Guaifenesin [phone] MG TB12 Take 1 tablet by mouth 2 (two) times daily. For congestion   sitaGLIPtin-metformin (JANUMET) 50-1000 MG tablet Take 1 tablet by mouth 2 (two) times daily with a meal.   triamcinolone ointment (KENALOG) 0.5 % APPLY TO AFFECTED AREA TWICE A DAY   No facility-administered encounter medications on file as of 06/16/2021.    Past Surgical History:   Procedure Laterality Date   HERNIA REPAIR      Family History  Problem Relation Age of Onset   Arthritis Mother    Asthma Mother    Diabetes Mother    Hypertension Mother    Hyperlipidemia Mother    Arthritis Father    Diabetes Father    Hypertension Father    Hyperlipidemia Father    GER disease Father       Controlled substance contract: n/a     Review of Systems  Constitutional:  Negative for diaphoresis.  Eyes:  Negative for pain.  Respiratory:  Negative for shortness of breath.   Cardiovascular:  Negative for chest pain, palpitations and leg swelling.  Gastrointestinal:  Negative for abdominal pain.  Endocrine: Negative for polydipsia.  Skin:  Negative for rash.  Neurological:  Negative for dizziness, weakness and headaches.  Hematological:  Does not bruise/bleed easily.  All other systems reviewed and are negative.     Objective:   Physical Exam Vitals and nursing note reviewed.  Constitutional:      General: She is not in acute distress.    Appearance: Normal appearance. She is well-developed.  HENT:     Head: Normocephalic.     Right Ear: Tympanic membrane normal.     Left Ear: Tympanic membrane normal.     Nose: Nose normal.     Mouth/Throat:     Mouth: Mucous membranes are moist.  Eyes:     Pupils: Pupils are equal, round, and reactive to light.  Neck:     Vascular: No carotid bruit or JVD.  Cardiovascular:     Rate and Rhythm: Normal rate and regular rhythm.     Heart sounds: Normal heart sounds.  Pulmonary:     Effort: Pulmonary effort is normal. No respiratory distress.     Breath sounds: Normal breath sounds. No wheezing or rales.  Chest:     Chest wall: No tenderness.  Abdominal:     General: Bowel sounds are normal. There is no distension or abdominal bruit.     Palpations: Abdomen is soft. There is no hepatomegaly, splenomegaly, mass or pulsatile mass.     Tenderness: There is no abdominal tenderness.  Musculoskeletal:         General: Normal range of motion.     Cervical back: Normal range of motion and neck supple.  Lymphadenopathy:     Cervical: No cervical adenopathy.  Skin:    General: Skin is warm and dry.  Neurological:     Mental Status: She is alert and oriented to person, place, and time.     Deep Tendon Reflexes: Reflexes are normal and symmetric.  Psychiatric:        Behavior: Behavior normal.        Thought Content: Thought content normal.        Judgment: Judgment normal.    BP (!) 148/83    Pulse 89    Temp  98 F (36.7 C) (Temporal)    Resp 20    Ht 5' 7"  (1.702 m)    Wt 153 lb (69.4 kg)    LMP 09/07/2015    SpO2 97%    BMI 23.96 kg/m   HGBa1c- 7.6      Assessment & Plan:   Brittany Jenkins comes in today with chief complaint of Medical Management of Chronic Issues   Diagnosis and orders addressed:  1. Type 2 diabetes mellitus without complication, without long-term current use of insulin (HCC) Continue to watch carbs in diet - Bayer DCA Hb A1c Waived - glipiZIDE (GLUCOTROL) 10 MG tablet; Take 1 tablet (10 mg total) by mouth 2 (two) times daily before a meal.  Dispense: 180 tablet; Refill: 1 - sitaGLIPtin-metformin (JANUMET) 50-1000 MG tablet; Take 1 tablet by mouth 2 (two) times daily with a meal.  Dispense: 180 tablet; Refill: 1  2. Hyperlipidemia, unspecified hyperlipidemia type Low fat diet - Lipid panel - atorvastatin (LIPITOR) 20 MG tablet; Take 1 tablet (20 mg total) by mouth daily.  Dispense: 90 tablet; Refill: 1  3. Hypertension associated with type 2 diabetes mellitus (HCC) Low sodium diet - CBC with Differential/Platelet - CMP14+EGFR - lisinopril-hydrochlorothiazide (ZESTORETIC) 10-12.5 MG tablet; Take 1 tablet by mouth daily.  Dispense: 90 tablet; Refill: 1  4. Hyperlipidemia associated with type 2 diabetes mellitus ([place])  5. Mild intermittent asthma without complication - fluticasone-salmeterol (ADVAIR DISKUS) 250-50 MCG/ACT AEPB; Inhale 1 puff into the lungs in  the morning and at bedtime.  Dispense: 60 each; Refill: 11  6. Gastroesophageal reflux disease without esophagitis Avoid spicy foods Do not eat 2 hours prior to bedtime  - omeprazole (PRILOSEC) 20 MG capsule; Take 1 capsule (20 mg total) by mouth 2 (two) times daily before a meal.  Dispense: 180 capsule; Refill: 1 - famotidine (PEPCID) 20 MG tablet; Take 1 tablet (20 mg total) by mouth 2 (two) times daily.  Dispense: 180 tablet; Refill: 2  Cologuard ordered Labs pending Health Maintenance reviewed Diet and exercise encouraged  Follow up plan: 3 months   Mary-Margaret Hassell Done, FNP

## 2021-06-17 LAB — CMP14+EGFR
ALT: 16 IU/L (ref 0–32)
AST: 14 IU/L (ref 0–40)
Albumin/Globulin Ratio: 1.7 (ref 1.2–2.2)
Albumin: 4.7 g/dL (ref 3.8–4.9)
Alkaline Phosphatase: 71 IU/L (ref 44–121)
BUN/Creatinine Ratio: 14 (ref 9–23)
BUN: 10 mg/dL (ref 6–24)
Bilirubin Total: 0.2 mg/dL (ref 0.0–1.2)
CO2: 25 mmol/L (ref 20–29)
Calcium: 10.1 mg/dL (ref 8.7–10.2)
Chloride: 100 mmol/L (ref 96–106)
Creatinine, Ser: 0.7 mg/dL (ref 0.57–1.00)
Globulin, Total: 2.8 g/dL (ref 1.5–4.5)
Glucose: 110 mg/dL — ABNORMAL HIGH (ref 70–99)
Potassium: 4.1 mmol/L (ref 3.5–5.2)
Sodium: 141 mmol/L (ref 134–144)
Total Protein: 7.5 g/dL (ref 6.0–8.5)
eGFR: 101 mL/min/{1.73_m2} (ref >59)

## 2021-06-17 LAB — CBC WITH DIFFERENTIAL/PLATELET
Basophils Absolute: 0.1 10*3/uL (ref 0.0–0.2)
Basos: 1 %
EOS (ABSOLUTE): 0.4 10*3/uL (ref 0.0–0.4)
Eos: 5 %
Hematocrit: 38 % (ref 34.0–46.6)
Hemoglobin: 12.1 g/dL (ref 11.1–15.9)
Immature Grans (Abs): 0 10*3/uL (ref 0.0–0.1)
Immature Granulocytes: 1 %
Lymphocytes Absolute: 2.2 10*3/uL (ref 0.7–3.1)
Lymphs: 26 %
MCH: 26.3 pg — ABNORMAL LOW (ref 26.6–33.0)
MCHC: 31.8 g/dL (ref 31.5–35.7)
MCV: 83 fL (ref 79–97)
Monocytes Absolute: 0.7 10*3/uL (ref 0.1–0.9)
Monocytes: 8 %
Neutrophils Absolute: 4.9 10*3/uL (ref 1.4–7.0)
Neutrophils: 59 %
Platelets: 553 10*3/uL — ABNORMAL HIGH (ref 150–450)
RBC: 4.6 x10E6/uL (ref 3.77–5.28)
RDW: 15.2 % (ref 11.7–15.4)
WBC: 8.3 10*3/uL (ref 3.4–10.8)

## 2021-06-17 LAB — LIPID PANEL
Chol/HDL Ratio: 4.2 ratio (ref 0.0–4.4)
Cholesterol, Total: 188 mg/dL (ref 100–199)
HDL: 45 mg/dL (ref >39)
LDL Chol Calc (NIH): 115 mg/dL — ABNORMAL HIGH (ref 0–99)
Triglycerides: 160 mg/dL — ABNORMAL HIGH (ref 0–149)
VLDL Cholesterol Cal: 28 mg/dL (ref 5–40)

## 2021-06-17 LAB — BAYER DCA HB A1C WAIVED: HB A1C (BAYER DCA - WAIVED): 7.6 % — ABNORMAL HIGH (ref 4.8–5.6)

## 2021-06-18 ENCOUNTER — Other Ambulatory Visit: Payer: Self-pay | Admitting: Nurse Practitioner

## 2021-06-18 DIAGNOSIS — D691 Qualitative platelet defects: Secondary | ICD-10-CM

## 2021-06-18 NOTE — Progress Notes (Unsigned)
Ref hematology- please let patient know referral has been made.

## 2021-06-18 NOTE — Progress Notes (Signed)
Patient aware.

## 2021-06-22 ENCOUNTER — Other Ambulatory Visit: Payer: Self-pay | Admitting: Nurse Practitioner

## 2021-06-22 DIAGNOSIS — J4520 Mild intermittent asthma, uncomplicated: Secondary | ICD-10-CM

## 2021-07-04 ENCOUNTER — Inpatient Hospital Stay (HOSPITAL_COMMUNITY): Payer: 59

## 2021-07-04 ENCOUNTER — Inpatient Hospital Stay (HOSPITAL_COMMUNITY): Payer: 59 | Attending: Hematology | Admitting: Hematology

## 2021-07-04 ENCOUNTER — Other Ambulatory Visit: Payer: Self-pay

## 2021-07-04 ENCOUNTER — Encounter (HOSPITAL_COMMUNITY): Payer: Self-pay | Admitting: Hematology

## 2021-07-04 DIAGNOSIS — E119 Type 2 diabetes mellitus without complications: Secondary | ICD-10-CM | POA: Diagnosis not present

## 2021-07-04 DIAGNOSIS — Z8679 Personal history of other diseases of the circulatory system: Secondary | ICD-10-CM | POA: Diagnosis not present

## 2021-07-04 DIAGNOSIS — Z801 Family history of malignant neoplasm of trachea, bronchus and lung: Secondary | ICD-10-CM | POA: Diagnosis not present

## 2021-07-04 DIAGNOSIS — Z8041 Family history of malignant neoplasm of ovary: Secondary | ICD-10-CM | POA: Diagnosis not present

## 2021-07-04 DIAGNOSIS — Z8616 Personal history of COVID-19: Secondary | ICD-10-CM | POA: Diagnosis not present

## 2021-07-04 DIAGNOSIS — D75839 Thrombocytosis, unspecified: Secondary | ICD-10-CM | POA: Diagnosis not present

## 2021-07-04 DIAGNOSIS — Z87891 Personal history of nicotine dependence: Secondary | ICD-10-CM | POA: Insufficient documentation

## 2021-07-04 DIAGNOSIS — I10 Essential (primary) hypertension: Secondary | ICD-10-CM | POA: Diagnosis not present

## 2021-07-04 LAB — CBC WITH DIFFERENTIAL/PLATELET
Abs Immature Granulocytes: 0.07 10*3/uL (ref 0.00–0.07)
Basophils Absolute: 0.1 10*3/uL (ref 0.0–0.1)
Basophils Relative: 1 %
Eosinophils Absolute: 0.5 10*3/uL (ref 0.0–0.5)
Eosinophils Relative: 7 %
HCT: 37.9 % (ref 36.0–46.0)
Hemoglobin: 11.9 g/dL — ABNORMAL LOW (ref 12.0–15.0)
Immature Granulocytes: 1 %
Lymphocytes Relative: 24 %
Lymphs Abs: 1.9 10*3/uL (ref 0.7–4.0)
MCH: 26.7 pg (ref 26.0–34.0)
MCHC: 31.4 g/dL (ref 30.0–36.0)
MCV: 85 fL (ref 80.0–100.0)
Monocytes Absolute: 0.4 10*3/uL (ref 0.1–1.0)
Monocytes Relative: 6 %
Neutro Abs: 4.9 10*3/uL (ref 1.7–7.7)
Neutrophils Relative %: 61 %
Platelets: 460 10*3/uL — ABNORMAL HIGH (ref 150–400)
RBC: 4.46 MIL/uL (ref 3.87–5.11)
RDW: 15.8 % — ABNORMAL HIGH (ref 11.5–15.5)
WBC: 7.9 10*3/uL (ref 4.0–10.5)
nRBC: 0 % (ref 0.0–0.2)

## 2021-07-04 LAB — SEDIMENTATION RATE: Sed Rate: 20 mm/hr (ref 0–22)

## 2021-07-04 LAB — IRON AND TIBC
Iron: 57 ug/dL (ref 28–170)
Saturation Ratios: 12 % (ref 10.4–31.8)
TIBC: 477 ug/dL — ABNORMAL HIGH (ref 250–450)
UIBC: 420 ug/dL

## 2021-07-04 LAB — FERRITIN: Ferritin: 4 ng/mL — ABNORMAL LOW (ref 11–307)

## 2021-07-04 LAB — C-REACTIVE PROTEIN: CRP: 0.6 mg/dL (ref <1.0)

## 2021-07-04 NOTE — Patient Instructions (Signed)
[facility] at [facility] Discharge Instructions  You were seen and examined today by Dr. Ellin Saba. Dr. Ellin Saba is a hematologist, meaning that he specializes in blood abnormalities. Dr. Ellin Saba discussed your past medical history, family history of cancers/blood conditions and the events that led to you being here today.  You were referred to Dr. Ellin Saba due to an abnormal platelet count. Your platelets were elevated. Platelets are the clotting factor within your blood. Dr. Ellin Saba has recommended additional lab work today in order to identify the cause of your increased platelets.  Follow-up as scheduled.   Thank you for choosing [facility] at [facility] to provide your oncology and hematology care.  To afford each patient quality time with our provider, please arrive at least 15 minutes before your scheduled appointment time.   If you have a lab appointment with the Cancer Center please come in thru the Main Entrance and check in at the main information desk.  You need to re-schedule your appointment should you arrive 10 or more minutes late.  We strive to give you quality time with our providers, and arriving late affects you and other patients whose appointments are after yours.  Also, if you no show three or more times for appointments you may be dismissed from the clinic at the providers discretion.     Again, thank you for choosing [facility].  Our hope is that these requests will decrease the amount of time that you wait before being seen by our physicians.       _____________________________________________________________  Should you have questions after your visit to [facility], please contact our office at [phone] and follow the prompts.  Our office hours are 8:00 a.m. and 4:30 p.m. Monday - Friday.  Please note that voicemails left after 4:00 p.m. may not be returned until the following  business day.  We are closed weekends and major holidays.  You do have access to a nurse 24-7, just call the main number to the clinic [phone] and do not press any options, hold on the line and a nurse will answer the phone.    For prescription refill requests, have your pharmacy contact our office and allow 72 hours.    Due to Covid, you will need to wear a mask upon entering the hospital. If you do not have a mask, a mask will be given to you at the Main Entrance upon arrival. For doctor visits, patients may have 1 support person age 2 or older with them. For treatment visits, patients can not have anyone with them due to social distancing guidelines and our immunocompromised population.

## 2021-07-04 NOTE — Progress Notes (Signed)
[facility] [address] [place] [address]   CLINIC:  Medical Oncology/Hematology  Patient Care Team: Chevis Pretty, [place] as PCP - General (Family Medicine) Derek Jack, MD as Medical Oncologist (Hematology)  CHIEF COMPLAINTS/PURPOSE OF CONSULTATION:  Evaluation of elevated platelet count.  HISTORY OF PRESENTING ILLNESS:  Brittany Jenkins 56 y.o. female is here because of evaluation of abnormal platelets, at the request of [facility].  Today she reports feeling good. She took antibiotics mid-January 2023 due to a sinus infection; she also reports an infection at the beginning of January. She had a Covid infection 1 year ago. She denies history of autoimmune issues or chronic illnesses. Her last surgery was 3 years ago, and she denies recent wounds. She denies fevers, night sweats, and weight loss. She reports her legs itch occasional, and she denies correlation to taking showers. She denies changing colors of fingertips. She reports 2 previous SVT in the left and right arms in her 31s. She had never taken blood thinner. She denies history of blood transfusions. She reports increased fatigue and abdominal cramping. She underwent genetic testing following her aunts' diagnoses with ovarian cancer, and she reports the test was negative at that time.    She currently lives at home. She denies family history of lupus and RA. Two paternal aunts passed from ovarian cancer, and her maternal grandmother and uncle had lung cancer. She works as a Public house manager. She quit smoking in 2000, and previously she smoked 1/2 ppd for 20 years.   MEDICAL HISTORY:  Past Medical History:  Diagnosis Date   Asthma    Diabetes mellitus without complication (HCC)    GERD (gastroesophageal reflux disease)    Hyperlipidemia    Hypertension    Microalbuminuria     SURGICAL HISTORY: Past Surgical History:  Procedure Laterality Date   HERNIA REPAIR      SOCIAL HISTORY: Social  History   Socioeconomic History   Marital status: Married    Spouse name: Not on file   Number of children: Not on file   Years of education: Not on file   Highest education level: Not on file  Occupational History   Not on file  Tobacco Use   Smoking status: Former    Types: Cigarettes    Quit date: 05/18/1998    Years since quitting: 23.1   Smokeless tobacco: Never  Vaping Use   Vaping Use: Never used  Substance and Sexual Activity   Alcohol use: Not Currently   Drug use: Never   Sexual activity: Not on file  Other Topics Concern   Not on file  Social History Narrative   Not on file   Social Determinants of Health   Financial Resource Strain: Not on file  Food Insecurity: Not on file  Transportation Needs: Not on file  Physical Activity: Not on file  Stress: Not on file  Social Connections: Not on file  Intimate Partner Violence: Not on file    FAMILY HISTORY: Family History  Problem Relation Age of Onset   Arthritis Mother    Asthma Mother    Diabetes Mother    Hypertension Mother    Hyperlipidemia Mother    Arthritis Father    Diabetes Father    Hypertension Father    Hyperlipidemia Father    GER disease Father     ALLERGIES:  is allergic to erythromycin and victoza [liraglutide].  MEDICATIONS:  Current Outpatient Medications  Medication Sig Dispense Refill   albuterol (VENTOLIN  HFA) 108 (90 Base) MCG/ACT inhaler USE 2 PUFFS EVERY 4 TO 6 HOURS AS A RESCUE FOR ASTHMA 18 each 1   aspirin 81 MG chewable tablet Chew by mouth daily.     diphenhydrAMINE (BENADRYL) 25 mg capsule Take 25 mg by mouth every other day.     famotidine (PEPCID) 20 MG tablet Take 1 tablet (20 mg total) by mouth 2 (two) times daily. 180 tablet 2   fluticasone-salmeterol (ADVAIR DISKUS) 250-50 MCG/ACT AEPB Inhale 1 puff into the lungs in the morning and at bedtime. 60 each 11   glipiZIDE (GLUCOTROL) 10 MG tablet Take 1 tablet (10 mg total) by mouth 2 (two) times daily before a meal.  180 tablet 1   lisinopril-hydrochlorothiazide (ZESTORETIC) 10-12.5 MG tablet Take 1 tablet by mouth daily. 90 tablet 1   Multiple Vitamin (MULTIVITAMIN PO) Take by mouth.     Omega-3 Fatty Acids (FISH OIL) 1000 MG CAPS Take by mouth.     omeprazole (PRILOSEC) 20 MG capsule Take 1 capsule (20 mg total) by mouth 2 (two) times daily before a meal. 180 capsule 1   sitaGLIPtin-metformin (JANUMET) 50-1000 MG tablet Take 1 tablet by mouth 2 (two) times daily with a meal. 180 tablet 1   atorvastatin (LIPITOR) 20 MG tablet Take 1 tablet (20 mg total) by mouth daily. (Patient not taking: Reported on 07/04/2021) 90 tablet 1   No current facility-administered medications for this visit.    REVIEW OF SYSTEMS:   Review of Systems  Constitutional:  Negative for appetite change and fatigue.  Respiratory:  Positive for cough and shortness of breath.   Cardiovascular:  Positive for palpitations.  Gastrointestinal:  Positive for abdominal pain, constipation, diarrhea and nausea.  All other systems reviewed and are negative.   PHYSICAL EXAMINATION: ECOG PERFORMANCE STATUS: 1 - Symptomatic but completely ambulatory  Vitals:   07/04/21 1026  BP: (!) 159/88  Pulse: 88  Resp: 18  Temp: 98.1 F (36.7 C)  SpO2: 95%   Filed Weights   07/04/21 1026  Weight: 152 lb 8.9 oz (69.2 kg)   Physical Exam Vitals reviewed.  Constitutional:      Appearance: Normal appearance.  Cardiovascular:     Rate and Rhythm: Normal rate and regular rhythm.     Pulses: Normal pulses.     Heart sounds: Normal heart sounds.  Pulmonary:     Effort: Pulmonary effort is normal.     Breath sounds: Normal breath sounds.  Abdominal:     Palpations: Abdomen is soft. There is no hepatomegaly, splenomegaly or mass.     Tenderness: There is no abdominal tenderness.  Musculoskeletal:     Right lower leg: No edema.     Left lower leg: No edema.  Lymphadenopathy:     Upper Body:     Right upper body: No supraclavicular or  axillary adenopathy.     Left upper body: No supraclavicular or axillary adenopathy.  Neurological:     General: No focal deficit present.     Mental Status: She is alert and oriented to person, place, and time.  Psychiatric:        Mood and Affect: Mood normal.        Behavior: Behavior normal.     LABORATORY DATA:  I have reviewed the data as listed Recent Results (from the past 2160 hour(s))  HM DIABETES EYE EXAM     Status: None   Collection Time: 06/10/21 12:00 AM  Result Value Ref Range   HM  Diabetic Eye Exam No Retinopathy No Retinopathy    Comment: Celestia Khat, OD  Bayer DCA Hb A1c Waived     Status: Abnormal   Collection Time: 06/16/21  4:12 PM  Result Value Ref Range   HB A1C (BAYER DCA - WAIVED) 7.6 (H) 4.8 - 5.6 %    Comment:          Prediabetes: 5.7 - 6.4          Diabetes: >6.4          Glycemic control for adults with diabetes: <7.0   CBC with Differential/Platelet     Status: Abnormal   Collection Time: 06/16/21  4:13 PM  Result Value Ref Range   WBC 8.3 3.4 - 10.8 x10E3/uL   RBC 4.60 3.77 - 5.28 x10E6/uL   Hemoglobin 12.1 11.1 - 15.9 g/dL   Hematocrit 38.0 34.0 - 46.6 %   MCV 83 79 - 97 fL   MCH 26.3 (L) 26.6 - 33.0 pg   MCHC 31.8 31.5 - 35.7 g/dL   RDW 15.2 11.7 - 15.4 %   Platelets 553 (H) 150 - 450 x10E3/uL   Neutrophils 59 Not Estab. %   Lymphs 26 Not Estab. %   Monocytes 8 Not Estab. %   Eos 5 Not Estab. %   Basos 1 Not Estab. %   Neutrophils Absolute 4.9 1.4 - 7.0 x10E3/uL   Lymphocytes Absolute 2.2 0.7 - 3.1 x10E3/uL   Monocytes Absolute 0.7 0.1 - 0.9 x10E3/uL   EOS (ABSOLUTE) 0.4 0.0 - 0.4 x10E3/uL   Basophils Absolute 0.1 0.0 - 0.2 x10E3/uL   Immature Granulocytes 1 Not Estab. %   Immature Grans (Abs) 0.0 0.0 - 0.1 x10E3/uL  CMP14+EGFR     Status: Abnormal   Collection Time: 06/16/21  4:13 PM  Result Value Ref Range   Glucose 110 (H) 70 - 99 mg/dL   BUN 10 6 - 24 mg/dL   Creatinine, Ser 0.70 0.57 - 1.00 mg/dL   eGFR 101 >59  mL/min/1.73   BUN/Creatinine Ratio 14 9 - 23   Sodium 141 134 - 144 mmol/L   Potassium 4.1 3.5 - 5.2 mmol/L   Chloride 100 96 - 106 mmol/L   CO2 25 20 - 29 mmol/L   Calcium 10.1 8.7 - 10.2 mg/dL   Total Protein 7.5 6.0 - 8.5 g/dL   Albumin 4.7 3.8 - 4.9 g/dL   Globulin, Total 2.8 1.5 - 4.5 g/dL   Albumin/Globulin Ratio 1.7 1.2 - 2.2   Bilirubin Total 0.2 0.0 - 1.2 mg/dL   Alkaline Phosphatase 71 44 - 121 IU/L   AST 14 0 - 40 IU/L   ALT 16 0 - 32 IU/L  Lipid panel     Status: Abnormal   Collection Time: 06/16/21  4:13 PM  Result Value Ref Range   Cholesterol, Total 188 100 - 199 mg/dL   Triglycerides 160 (H) 0 - 149 mg/dL   HDL 45 >39 mg/dL   VLDL Cholesterol Cal 28 5 - 40 mg/dL   LDL Chol Calc (NIH) 115 (H) 0 - 99 mg/dL   Chol/HDL Ratio 4.2 0.0 - 4.4 ratio    Comment:                                   T. Chol/HDL Ratio  Men  Women                               1/2 Avg.Risk  3.4    3.3                                   Avg.Risk  5.0    4.4                                2X Avg.Risk  9.6    7.1                                3X Avg.Risk 23.4   11.0     RADIOGRAPHIC STUDIES: I have personally reviewed the radiological images as listed and agreed with the findings in the report. MM 3D SCREEN BREAST BILATERAL  Result Date: 06/17/2021 CLINICAL DATA:  Screening. EXAM: DIGITAL SCREENING BILATERAL MAMMOGRAM WITH TOMOSYNTHESIS AND CAD TECHNIQUE: Bilateral screening digital craniocaudal and mediolateral oblique mammograms were obtained. Bilateral screening digital breast tomosynthesis was performed. The images were evaluated with computer-aided detection. COMPARISON:  Previous exam(s). ACR Breast Density Category b: There are scattered areas of fibroglandular density. FINDINGS: There are no findings suspicious for malignancy. IMPRESSION: No mammographic evidence of malignancy. A result letter of this screening mammogram will be mailed directly to  the patient. RECOMMENDATION: Screening mammogram in one year. (Code:SM-B-01Y) BI-RADS CATEGORY  1: Negative. Electronically Signed   By: Ammie Ferrier M.D.   On: 06/17/2021 09:00    ASSESSMENT:  Elevated platelet count: - Patient seen at the request of [place] FNP for elevated platelet count. - CBC on 06/16/2021 PLT-553. - Platelet count has been elevated since 11/23/2019. - Denies any aquagenic pruritus or vasomotor symptoms.  However she has itching on the legs which happens randomly. - No history of connective tissue disorders.  Had 1 episode of cold/sinus infection in January and COVID infection in December 2021. - No recent surgeries or unhealed wounds.  No B symptoms. - No prior history of DVT/CVA.  She had superficial phlebitis of both arms in her 30s.   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  Quit smoking in 2000.  Smoked half pack per day for 20 years. - No family history of connective tissue disorders.  Maternal grandmother and maternal uncle had lung cancer.  2 paternal aunts had ovarian cancer.   PLAN:  Elevated platelet count: - We discussed the causes of elevated platelet count including reactive and clonal thrombocytosis. - She does not appear to have any clear triggering factors to cause reactive thrombocytosis.  However will repeat CBC with differential today and check ESR/CRP and ANA/RF.  We will also check ferritin and iron panel. - Will check for clonal processess with JAK2 V6 10F with reflex testing and BCR/ABL by FISH. - RTC 3 to 4 weeks for follow-up.   All questions were answered. The patient knows to call the clinic with any problems, questions or concerns.  Derek Jack, MD 07/04/21 10:54 AM  [place] [phone]   I, Thana Ates, am acting as a scribe for Dr. Derek Jack.  I, Derek Jack MD, have reviewed the above documentation for accuracy and completeness, and  I  agree with the above.

## 2021-07-05 LAB — RHEUMATOID FACTOR: Rheumatoid fact SerPl-aCnc: 12 IU/mL (ref <14.0)

## 2021-07-08 LAB — BCR-ABL1 FISH
Cells Analyzed: 200
Cells Counted: 200

## 2021-07-11 LAB — ANTINUCLEAR ANTIBODIES, IFA: ANA Ab, IFA: NEGATIVE

## 2021-07-15 DIAGNOSIS — Z1211 Encounter for screening for malignant neoplasm of colon: Secondary | ICD-10-CM | POA: Diagnosis not present

## 2021-07-15 DIAGNOSIS — Z1212 Encounter for screening for malignant neoplasm of rectum: Secondary | ICD-10-CM | POA: Diagnosis not present

## 2021-07-16 LAB — CALR + JAK2 E12-15 + MPL (REFLEXED)

## 2021-07-16 LAB — JAK2 V617F, W REFLEX TO CALR/E12/MPL

## 2021-07-17 ENCOUNTER — Telehealth: Payer: Self-pay | Admitting: Nurse Practitioner

## 2021-07-22 ENCOUNTER — Telehealth: Payer: Self-pay | Admitting: Family Medicine

## 2021-07-22 DIAGNOSIS — E119 Type 2 diabetes mellitus without complications: Secondary | ICD-10-CM

## 2021-07-22 MED ORDER — JANUMET 50-1000 MG PO TABS: 1.0000 | ORAL_TABLET | Freq: Two times a day (BID) | ORAL | 1 refills | Status: DC

## 2021-07-22 NOTE — Telephone Encounter (Signed)
Patient aware and verbalized understanding. °

## 2021-07-22 NOTE — Telephone Encounter (Signed)
Patient has commercial insurance ?I'm unable to complete patient assistance for her ?If she has high deductible plan -- copay cards will not work ?If she needs additional support she can go to the [employer] and download an application and fill out ?But I can no longer do patient assistance for commercially insured (only for medicare and uninsured) ? ?She can make appt if she needs additional diabetes management ?

## 2021-07-23 NOTE — Telephone Encounter (Signed)
Pt called to let MMM know that her Janumet Rx is requiring a PA. ?

## 2021-07-24 LAB — COLOGUARD: COLOGUARD: NEGATIVE

## 2021-07-25 NOTE — Progress Notes (Addendum)
[place], [place] [address]   CLINIC:  Medical Oncology/Hematology  PCP:  Chevis Pretty, [place] [address] [place] [address] [phone]   REASON FOR VISIT:  Follow-up for thrombocytosis  PRIOR THERAPY: None  CURRENT THERAPY: Under work-up  INTERVAL HISTORY:  [name] 56 y.o. female returns for routine follow-up of her thrombocytosis.  She was seen for initial consultation by Dr. Delton Coombes on 07/04/2021.  She returns today to discuss the results of that work-up.  At today's visit, she reports feeling fairly well.  No recent hospitalizations, surgeries, or changes in baseline health status.  Her legs occasionally itch, but she denies any correlation to taking showers. She denies any Raynaud's phenomenon. She denies any other vasomotor symptoms such as tinnitus, blurry vision, strokelike symptoms, or neuropathy. She denies any fever, night sweats, or unexplained weight loss.  Work-up of thrombocytosis revealed significant iron deficiency. She denies any sources of blood loss such as hematemesis, hematochezia, melena, or epistaxis.  She denies any uterine or vaginal bleeding; she is postmenopausal. She is symptomatic with fatigue, reports that she "feels like her gas tank is completely out."  She also has restless legs and occasional palpitations.  She denies any pica, headaches, chest pain, dyspnea on exertion, lightheadedness, or syncope. She eats a fairly balanced diet, but has been on high doses of antacid medications (2 Prilosec in the morning and 2 Pepcid in the evening), which may have been affecting her body's ability to absorb iron.  She takes aspirin daily and Advil every other day. She does not take any iron supplement.  She has never received IV iron infusion or blood transfusion. She has never had EGD or colonoscopy but recently had Cologuard test done that was negative.  She has 80% energy and 100% appetite.  She endorses that she is maintaining a stable weight.   REVIEW OF SYSTEMS:  Review of Systems  Constitutional:  Positive for fatigue. Negative for appetite change, chills, diaphoresis, fever and unexpected weight change.  HENT:   Negative for lump/mass and nosebleeds.   Eyes:  Negative for eye problems.  Respiratory:  Positive for shortness of breath (asthma). Negative for cough and hemoptysis.   Cardiovascular:  Positive for palpitations. Negative for chest pain and leg swelling.  Gastrointestinal:  Negative for abdominal pain, blood in stool, constipation, diarrhea, nausea and vomiting.  Genitourinary:  Negative for hematuria.   Skin: Negative.   Neurological:  Negative for dizziness, headaches and light-headedness.  Hematological:  Does not bruise/bleed easily.  Psychiatric/Behavioral:  Positive for sleep disturbance.      PAST MEDICAL/SURGICAL HISTORY:  Past Medical History:  Diagnosis Date   Asthma    Diabetes mellitus without complication (HCC)    GERD (gastroesophageal reflux disease)    Hyperlipidemia    Hypertension    Microalbuminuria    Past Surgical History:  Procedure Laterality Date   HERNIA REPAIR       SOCIAL HISTORY:  Social History   Socioeconomic History   Marital status: Married    Spouse name: Not on file   Number of children: Not on file   Years of education: Not on file   Highest education level: Not on file  Occupational History   Not on file  Tobacco Use   Smoking status: Former    Types: Cigarettes    Quit date: 05/18/1998    Years since quitting: 23.2   Smokeless tobacco: Never  Vaping Use   Vaping Use:  Never used  Substance and Sexual Activity   Alcohol use: Not Currently   Drug use: Never   Sexual activity: Not on file  Other Topics Concern   Not on file  Social History Narrative   Not on file   Social Determinants of Health   Financial Resource Strain: Not on file  Food Insecurity: Not on file  Transportation Needs: Not on  file  Physical Activity: Not on file  Stress: Not on file  Social Connections: Not on file  Intimate Partner Violence: Not on file    FAMILY HISTORY:  Family History  Problem Relation Age of Onset   Arthritis Mother    Asthma Mother    Diabetes Mother    Hypertension Mother    Hyperlipidemia Mother    Arthritis Father    Diabetes Father    Hypertension Father    Hyperlipidemia Father    GER disease Father     CURRENT MEDICATIONS:  Outpatient Encounter Medications as of 07/28/2021  Medication Sig   albuterol (VENTOLIN HFA) 108 (90 Base) MCG/ACT inhaler USE 2 PUFFS EVERY 4 TO 6 HOURS AS A RESCUE FOR ASTHMA   aspirin 81 MG chewable tablet Chew by mouth daily.   atorvastatin (LIPITOR) 20 MG tablet Take 1 tablet (20 mg total) by mouth daily. (Patient not taking: Reported on 07/04/2021)   diphenhydrAMINE (BENADRYL) 25 mg capsule Take 25 mg by mouth every other day.   famotidine (PEPCID) 20 MG tablet Take 1 tablet (20 mg total) by mouth 2 (two) times daily.   fluticasone-salmeterol (ADVAIR DISKUS) 250-50 MCG/ACT AEPB Inhale 1 puff into the lungs in the morning and at bedtime.   glipiZIDE (GLUCOTROL) 10 MG tablet Take 1 tablet (10 mg total) by mouth 2 (two) times daily before a meal.   lisinopril-hydrochlorothiazide (ZESTORETIC) 10-12.5 MG tablet Take 1 tablet by mouth daily.   Multiple Vitamin (MULTIVITAMIN PO) Take by mouth.   Omega-3 Fatty Acids (FISH OIL) 1000 MG CAPS Take by mouth.   omeprazole (PRILOSEC) 20 MG capsule Take 1 capsule (20 mg total) by mouth 2 (two) times daily before a meal.   sitaGLIPtin-metformin (JANUMET) 50-1000 MG tablet Take 1 tablet by mouth 2 (two) times daily with a meal.   No facility-administered encounter medications on file as of 07/28/2021.    ALLERGIES:  Allergies  Allergen Reactions   Erythromycin Nausea And Vomiting    Severe vomiting   Victoza [Liraglutide] Diarrhea and Nausea Only     PHYSICAL EXAM:  ECOG PERFORMANCE STATUS: 1 -  Symptomatic but completely ambulatory  There were no vitals filed for this visit. There were no vitals filed for this visit. Physical Exam Constitutional:      Appearance: Normal appearance.  HENT:     Head: Normocephalic and atraumatic.     Mouth/Throat:     Mouth: Mucous membranes are moist.  Eyes:     Extraocular Movements: Extraocular movements intact.     Pupils: Pupils are equal, round, and reactive to light.  Cardiovascular:     Rate and Rhythm: Normal rate and regular rhythm.     Pulses: Normal pulses.     Heart sounds: Normal heart sounds.  Pulmonary:     Effort: Pulmonary effort is normal.     Breath sounds: Normal breath sounds.  Abdominal:     General: Bowel sounds are normal.     Palpations: Abdomen is soft.     Tenderness: There is no abdominal tenderness.  Musculoskeletal:  General: No swelling.     Right lower leg: No edema.     Left lower leg: No edema.  Lymphadenopathy:     Cervical: No cervical adenopathy.  Skin:    General: Skin is warm and dry.  Neurological:     General: No focal deficit present.     Mental Status: She is alert and oriented to person, place, and time.  Psychiatric:        Mood and Affect: Mood normal.        Behavior: Behavior normal.     LABORATORY DATA:  I have reviewed the labs as listed.  CBC    Component Value Date/Time   WBC 7.9 07/04/2021 1056   RBC 4.46 07/04/2021 1056   HGB 11.9 (L) 07/04/2021 1056   HGB 12.1 06/16/2021 1613   HCT 37.9 07/04/2021 1056   HCT 38.0 06/16/2021 1613   PLT 460 (H) 07/04/2021 1056   PLT 553 (H) 06/16/2021 1613   MCV 85.0 07/04/2021 1056   MCV 83 06/16/2021 1613   MCH 26.7 07/04/2021 1056   MCHC 31.4 07/04/2021 1056   RDW 15.8 (H) 07/04/2021 1056   RDW 15.2 06/16/2021 1613   LYMPHSABS 1.9 07/04/2021 1056   LYMPHSABS 2.2 06/16/2021 1613   MONOABS 0.4 07/04/2021 1056   EOSABS 0.5 07/04/2021 1056   EOSABS 0.4 06/16/2021 1613   BASOSABS 0.1 07/04/2021 1056   BASOSABS 0.1  06/16/2021 1613   CMP Latest Ref Rng & Units 06/16/2021 08/08/2020 11/23/2019  Glucose 70 - 99 mg/dL 110(H) 117(H) 297(H)  BUN 6 - 24 mg/dL 10 10 12   Creatinine 0.57 - 1.00 mg/dL 0.70 0.66 0.66  Sodium 134 - 144 mmol/L 141 143 140  Potassium 3.5 - 5.2 mmol/L 4.1 4.2 3.6  Chloride 96 - 106 mmol/L 100 102 99  CO2 20 - 29 mmol/L 25 23 26   Calcium 8.7 - 10.2 mg/dL 10.1 10.1 9.3  Total Protein 6.0 - 8.5 g/dL 7.5 7.2 6.6  Total Bilirubin 0.0 - 1.2 mg/dL 0.2 0.3 <0.2  Alkaline Phos 44 - 121 IU/L 71 71 75  AST 0 - 40 IU/L 14 15 14   ALT 0 - 32 IU/L 16 18 19     DIAGNOSTIC IMAGING:  I have independently reviewed the relevant imaging and discussed with the patient.  ASSESSMENT & PLAN: 1.  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 - Platelet count has been elevated since 11/23/2019.  CBC on 06/16/2021 PLT 553. - No prior history of DVT/CVA.  She had superficial phlebitis of both arms in her 30s. - No connective tissue disorders or autoimmune disease. - Denies any aquagenic pruritus or vasomotor symptoms.   - No B symptoms.   - Hematology work-up (07/04/2021): Platelets 460.  Ferritin 4, iron saturation 12%, TIBC 477.  RF/ANA negative.  Normal ESR and CRP. - MPN work-up negative.  Negative BCR/ABL FISH, JAK2, CALR, and MPL - PLAN: Thrombocytosis is reactive in the setting of iron deficiency.  Treatment of iron deficiency as below.  2.  Iron deficiency without anemia - Significant iron deficiency discovered during work-up of thrombocytosis (07/04/2021) with ferritin 4, iron saturation 12%, TIBC 477.  Hgb essentially normal at 11.9 with MCV 85.0. - No bright red blood per rectum or melena - She does not take any iron supplement.  No history of blood transfusion - She has never had EGD or colonoscopy, but recent Cologuard test was negative -  She is symptomatic with fatigue, restless legs, and  palpitations - Etiology of iron deficiency suspected to be malabsorption in the setting of high-dose PPI/H2 blocker use - PLAN: Recommended trial of oral iron (ferrous bisglycinate) with repeat labs and RTC in 3 months  3.  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  Quit smoking in 2000.  Smoked half pack per day for 20 years. - No family history of connective tissue disorders.  Maternal grandmother and maternal uncle had lung cancer.  2 paternal aunts had ovarian cancer.   PLAN SUMMARY & DISPOSITION: Labs and RTC in 3 months  All questions were answered. The patient knows to call the clinic with any problems, questions or concerns.  Medical decision making: Low  Time spent on visit: I spent 15 minutes counseling the patient face to face. The total time spent in the appointment was 25 minutes and more than 50% was on counseling.   Harriett Rush, PA-C  07/28/21 7:01 PM

## 2021-07-28 ENCOUNTER — Other Ambulatory Visit: Payer: Self-pay

## 2021-07-28 ENCOUNTER — Inpatient Hospital Stay (HOSPITAL_COMMUNITY): Payer: 59 | Attending: Hematology | Admitting: Physician Assistant

## 2021-07-28 VITALS — BP 152/84 | HR 79 | Temp 97.7°F | Resp 18 | Ht 66.0 in | Wt 153.7 lb

## 2021-07-28 DIAGNOSIS — Z801 Family history of malignant neoplasm of trachea, bronchus and lung: Secondary | ICD-10-CM | POA: Insufficient documentation

## 2021-07-28 DIAGNOSIS — Z87891 Personal history of nicotine dependence: Secondary | ICD-10-CM | POA: Diagnosis not present

## 2021-07-28 DIAGNOSIS — I10 Essential (primary) hypertension: Secondary | ICD-10-CM | POA: Insufficient documentation

## 2021-07-28 DIAGNOSIS — D75838 Other thrombocytosis: Secondary | ICD-10-CM | POA: Diagnosis not present

## 2021-07-28 DIAGNOSIS — E611 Iron deficiency: Secondary | ICD-10-CM | POA: Diagnosis not present

## 2021-07-28 DIAGNOSIS — Z8041 Family history of malignant neoplasm of ovary: Secondary | ICD-10-CM | POA: Insufficient documentation

## 2021-07-28 DIAGNOSIS — D75839 Thrombocytosis, unspecified: Secondary | ICD-10-CM | POA: Diagnosis not present

## 2021-07-28 DIAGNOSIS — E119 Type 2 diabetes mellitus without complications: Secondary | ICD-10-CM | POA: Diagnosis not present

## 2021-07-28 NOTE — Patient Instructions (Addendum)
[facility] at [facility] ?Discharge Instructions ? ?You were seen today by Rojelio Brenner PA-C for your elevated platelets and low iron. ? ?HIGH PLATELETS: The test we checked for your high platelets did not show any signs of genetic mutation, blood cancer, or bone marrow disorders that would have caused high platelets.  The only abnormal test was that your iron was extremely low, which can cause your body to have reactive elevations in platelets. ? ?LOW IRON: This may be due to decreased ability of your body to absorb iron due to your chronic stomach issues and high doses of acid blocking medications.  I recommend that you start taking a daily over-the-counter iron supplement with the following instructions: ?Try FERROUS BISGLYCINATE, which is an over-the-counter iron that tends to be gentler on the stomach.  If you cannot find it at your local drugstore, you will be able to find it online.  (Traditional over-the-counter iron ferrous sulfate can cause higher levels of stomach upset and constipation). ?Take your iron supplement with a glass of orange juice in the morning, which helps your body absorb it better. ?Do NOT take your iron supplement at the same time as any acid blocking medications.  This medication should be separated by at least 4 hours. ?Take your iron supplement with food to reduce your stomach upset. ?If you have any mild constipation from your iron pill, you can take an over-the-counter stool softener. ? ?FOLLOW-UP APPOINTMENT: Labs and follow-up visit in 3 months ? ? ?Thank you for choosing [facility] at [facility] to provide your oncology and hematology care.  To afford each patient quality time with our provider, please arrive at least 15 minutes before your scheduled appointment time.  ? ?If you have a lab appointment with the Cancer Center please come in thru the Main Entrance and check in at the main information desk. ? ?You need to  re-schedule your appointment should you arrive 10 or more minutes late.  We strive to give you quality time with our providers, and arriving late affects you and other patients whose appointments are after yours.  Also, if you no show three or more times for appointments you may be dismissed from the clinic at the providers discretion.     ?Again, thank you for choosing [facility].  Our hope is that these requests will decrease the amount of time that you wait before being seen by our physicians.       ?_____________________________________________________________ ? ?Should you have questions after your visit to [facility], please contact our office at [phone] and follow the prompts.  Our office hours are 8:00 a.m. and 4:30 p.m. Monday - Friday.  Please note that voicemails left after 4:00 p.m. may not be returned until the following business day.  We are closed weekends and major holidays.  You do have access to a nurse 24-7, just call the main number to the clinic [phone] and do not press any options, hold on the line and a nurse will answer the phone.   ? ?For prescription refill requests, have your pharmacy contact our office and allow 72 hours.   ? ?Due to Covid, you will need to wear a mask upon entering the hospital. If you do not have a mask, a mask will be given to you at the Main Entrance upon arrival. For doctor visits, patients may have 1 support person age 55 or older with them. For  treatment visits, patients can not have anyone with them due to social distancing guidelines and our immunocompromised population.  ? ? ? ?

## 2021-07-30 ENCOUNTER — Other Ambulatory Visit: Payer: Self-pay

## 2021-07-31 ENCOUNTER — Telehealth: Payer: Self-pay

## 2021-07-31 NOTE — Telephone Encounter (Signed)
Approved Drug ?Janumet 50-1000MG  tablets ?Your PA request has been approved for 07/31/2021 to 08/01/2022. ? ?Pharmacy aware ? ? ? ?

## 2021-07-31 NOTE — Telephone Encounter (Signed)
KeyGaspar Garbe - PA Case ID: [id number] - Rx #: [id number] ?Janumet 50-1000MG  tablets ? ?Send to plan ?

## 2021-08-05 ENCOUNTER — Other Ambulatory Visit: Payer: Self-pay | Admitting: Nurse Practitioner

## 2021-08-05 DIAGNOSIS — J4520 Mild intermittent asthma, uncomplicated: Secondary | ICD-10-CM

## 2021-08-24 DIAGNOSIS — R112 Nausea with vomiting, unspecified: Secondary | ICD-10-CM | POA: Diagnosis not present

## 2021-08-24 DIAGNOSIS — K529 Noninfective gastroenteritis and colitis, unspecified: Secondary | ICD-10-CM | POA: Diagnosis not present

## 2021-08-24 DIAGNOSIS — R1084 Generalized abdominal pain: Secondary | ICD-10-CM | POA: Diagnosis not present

## 2021-08-24 DIAGNOSIS — R197 Diarrhea, unspecified: Secondary | ICD-10-CM | POA: Diagnosis not present

## 2021-08-24 DIAGNOSIS — R000 Tachycardia, unspecified: Secondary | ICD-10-CM | POA: Diagnosis not present

## 2021-08-25 ENCOUNTER — Encounter (HOSPITAL_COMMUNITY): Payer: Self-pay

## 2021-08-25 MED ORDER — JANUMET XR 100-1000 MG PO TB24: 1.0000 | ORAL_TABLET | Freq: Every day | ORAL | 2 refills | Status: DC

## 2021-08-29 ENCOUNTER — Other Ambulatory Visit: Payer: Self-pay | Admitting: Nurse Practitioner

## 2021-08-29 DIAGNOSIS — J4520 Mild intermittent asthma, uncomplicated: Secondary | ICD-10-CM

## 2021-09-01 ENCOUNTER — Encounter: Payer: Self-pay | Admitting: Nurse Practitioner

## 2021-09-01 ENCOUNTER — Ambulatory Visit (INDEPENDENT_AMBULATORY_CARE_PROVIDER_SITE_OTHER): Payer: 59 | Admitting: Nurse Practitioner

## 2021-09-01 VITALS — BP 133/82 | HR 80 | Temp 98.1°F | Resp 20 | Ht 66.0 in | Wt 150.0 lb

## 2021-09-01 DIAGNOSIS — K219 Gastro-esophageal reflux disease without esophagitis: Secondary | ICD-10-CM

## 2021-09-01 DIAGNOSIS — E1159 Type 2 diabetes mellitus with other circulatory complications: Secondary | ICD-10-CM

## 2021-09-01 DIAGNOSIS — I152 Hypertension secondary to endocrine disorders: Secondary | ICD-10-CM

## 2021-09-01 MED ORDER — LISINOPRIL 20 MG PO TABS: 20.0000 mg | ORAL_TABLET | Freq: Every day | ORAL | 1 refills | Status: DC

## 2021-09-01 NOTE — Progress Notes (Signed)
? ?Subjective:  ? ? Patient ID: Brittany Jenkins, female    DOB: [birth year], 56 y.o.   MRN: [id number] ? ? ?Chief Complaint: Hospitalization Follow-up (Wants referral to GI/Wants to discuss stopping fluid pill) ? ? ?HPI ?Patient went to the ED on 08/24/21 with abdominal pain. She was dx with gastroenteritis. She has since gotten better. But she wants referral to GI. She says she has had acid reflux for years. She has backed off some of her omeperzole and pepcid. But at night time she feels like she has a lump in her stomach and gets reflux.  ? ?She also wants to stop her fluid pill, she thinks that is causing her too be dehydrated.  ? ?Review of Systems  ?Constitutional:  Negative for diaphoresis.  ?Eyes:  Negative for pain.  ?Respiratory:  Negative for shortness of breath.   ?Cardiovascular:  Negative for chest pain, palpitations and leg swelling.  ?Gastrointestinal:  Positive for abdominal pain (intermittent) and diarrhea. Negative for constipation, nausea and vomiting.  ?Endocrine: Negative for polydipsia.  ?Skin:  Negative for rash.  ?Neurological:  Negative for dizziness, weakness and headaches.  ?Hematological:  Does not bruise/bleed easily.  ?All other systems reviewed and are negative. ? ?   ?Objective:  ? Physical Exam ?Vitals and nursing note reviewed.  ?Constitutional:   ?   General: She is not in acute distress. ?   Appearance: Normal appearance. She is well-developed.  ?HENT:  ?   Head: Normocephalic.  ?   Right Ear: Tympanic membrane normal.  ?   Left Ear: Tympanic membrane normal.  ?   Nose: Nose normal.  ?   Mouth/Throat:  ?   Mouth: Mucous membranes are moist.  ?Eyes:  ?   Pupils: Pupils are equal, round, and reactive to light.  ?Neck:  ?   Vascular: No carotid bruit or JVD.  ?Cardiovascular:  ?   Rate and Rhythm: Normal rate and regular rhythm.  ?   Heart sounds: Normal heart sounds.  ?Pulmonary:  ?   Effort: Pulmonary effort is normal. No respiratory distress.  ?   Breath sounds: Normal breath  sounds. No wheezing or rales.  ?Chest:  ?   Chest wall: No tenderness.  ?Abdominal:  ?   General: Bowel sounds are normal. There is no distension or abdominal bruit.  ?   Palpations: Abdomen is soft. There is no hepatomegaly, splenomegaly, mass or pulsatile mass.  ?   Tenderness: There is no abdominal tenderness.  ?Musculoskeletal:     ?   General: Normal range of motion.  ?   Cervical back: Normal range of motion and neck supple.  ?Lymphadenopathy:  ?   Cervical: No cervical adenopathy.  ?Skin: ?   General: Skin is warm and dry.  ?Neurological:  ?   Mental Status: She is alert and oriented to person, place, and time.  ?   Deep Tendon Reflexes: Reflexes are normal and symmetric.  ?Psychiatric:     ?   Behavior: Behavior normal.     ?   Thought Content: Thought content normal.     ?   Judgment: Judgment normal.  ? ? ?BP 133/82   Pulse 80   Temp 98.1 ?F (36.7 ?C) (Temporal)   Resp 20   Ht 5\' 6"  (1.676 m)   Wt 150 lb (68 kg)   LMP 09/07/2015   SpO2 96%   BMI 24.21 kg/m?  ? ? ? ?   ?Assessment & Plan:  ?Vadie Pearl in today  with chief complaint of Hospitalization Follow-up (Wants referral to GI/Wants to discuss stopping fluid pill) ? ? ?1. Hypertension associated with type 2 diabetes mellitus ([place]) ?Stop lisinoril 10/12.5- chnagred to lisinopril 20mg  daily ?- lisinopril (ZESTRIL) 20 MG tablet; Take 1 tablet (20 mg total) by mouth daily.  Dispense: 90 tablet; Refill: -  ? ?2. Gastroesophageal reflux disease without esophagitis ?Avoid spicy foods ?Do not eat 2 hours prior to bedtime ?ED records reviewed ? ?- Ambulatory referral to Gastroenterology ? ? ? ?The above assessment and management plan was discussed with the patient. The patient verbalized understanding of and has agreed to the management plan. Patient is aware to call the clinic if symptoms persist or worsen. Patient is aware when to return to the clinic for a follow-up visit. Patient educated on when it is appropriate to go to the emergency  department.  ? ?Mary-Margaret Hassell Done, FNP ? ? ? ?

## 2021-09-02 ENCOUNTER — Telehealth: Payer: Self-pay | Admitting: Nurse Practitioner

## 2021-09-02 DIAGNOSIS — K219 Gastro-esophageal reflux disease without esophagitis: Secondary | ICD-10-CM

## 2021-09-02 NOTE — Telephone Encounter (Signed)
Alternative Requested:INSURANCE WILL ONLY COVER A 90 DAY SUPPLY EVERY YEAR UNLESS A PRIOR AUTH IS COMPLETED FOR LONGER THERAPY; NEEDS PRIOR AUTH FOR INSURANCE TO COVER. ?

## 2021-09-03 NOTE — Telephone Encounter (Addendum)
Key: [id number] - PA Case ID: [id number] Need help? Call us at [phone] ?Status ?Sent to [employer] ?Drug ?Omeprazole 20MG  dr capsules ?

## 2021-09-04 NOTE — Telephone Encounter (Signed)
Darlina Sicilian KeyFuller [place] - PA Case [id number] Need help? Call us at [phone] ?Outcome ?Approvedon April 19 ?Your PA request has been approved. Additional information will be provided in the approval communication. (Message 1145) ?Drug ?Omeprazole 20MG  dr capsules ?Form ?[employer] Electronic PA Form [phone] NCPDP) ?

## 2021-09-15 ENCOUNTER — Ambulatory Visit: Payer: 59 | Admitting: Nurse Practitioner

## 2021-09-17 ENCOUNTER — Other Ambulatory Visit: Payer: Self-pay

## 2021-09-17 ENCOUNTER — Other Ambulatory Visit: Payer: Self-pay | Admitting: Nurse Practitioner

## 2021-09-17 ENCOUNTER — Telehealth: Payer: Self-pay | Admitting: Nurse Practitioner

## 2021-09-17 ENCOUNTER — Telehealth: Payer: Self-pay

## 2021-09-17 DIAGNOSIS — J4520 Mild intermittent asthma, uncomplicated: Secondary | ICD-10-CM

## 2021-09-17 MED ORDER — JANUMET XR 100-1000 MG PO TB24: 1.0000 | ORAL_TABLET | Freq: Every day | ORAL | 2 refills | Status: DC

## 2021-09-17 NOTE — Telephone Encounter (Signed)
Sent to plan. Awaiting response ? ?Key: [id number] - ? ? Rx #: [id number] ? ?Need help? Call us at [phone] ? ?Status ?Sent to Plan today ? ?Drug ?Janumet XR 100-1000MG  er tablets ? ?Form ?[employer] Electronic PA Form [id number] NCPDP) ?Original Claim Info ?[phone] MUST MEET STEP, PA REQR [phone] THERAPY IS REQUIREDDRUG REQUIRES PRIOR AUTHORIZATION(PHARMACY HELP DESK [phone]) ?

## 2021-09-17 NOTE — Telephone Encounter (Signed)
[facility]et [facility] to [facility] [place]. ?

## 2021-09-22 NOTE — Telephone Encounter (Signed)
Received  fax from [facility] [employer] that states: ? ?We're pleased to let you know that we've approved your or your doctors request for coverage for Janumet XR 100-1000mg . As long as you remain covered by your prescription drug plan and there are no changes to your plan benefits, this request is approved from 09/19/21-09/20/22. ? ?[facility] notified ?

## 2021-10-28 ENCOUNTER — Inpatient Hospital Stay (HOSPITAL_COMMUNITY): Payer: 59 | Admitting: Physician Assistant

## 2021-10-28 ENCOUNTER — Other Ambulatory Visit: Payer: Self-pay | Admitting: Nurse Practitioner

## 2021-10-28 ENCOUNTER — Inpatient Hospital Stay (HOSPITAL_COMMUNITY): Payer: 59

## 2021-10-28 DIAGNOSIS — J4520 Mild intermittent asthma, uncomplicated: Secondary | ICD-10-CM

## 2021-10-28 NOTE — Progress Notes (Deleted)
NO SHOW

## 2021-12-09 ENCOUNTER — Other Ambulatory Visit: Payer: Self-pay | Admitting: Nurse Practitioner

## 2021-12-09 DIAGNOSIS — J4520 Mild intermittent asthma, uncomplicated: Secondary | ICD-10-CM

## 2022-01-14 ENCOUNTER — Other Ambulatory Visit: Payer: Self-pay | Admitting: Nurse Practitioner

## 2022-01-14 DIAGNOSIS — J4520 Mild intermittent asthma, uncomplicated: Secondary | ICD-10-CM

## 2022-01-31 ENCOUNTER — Other Ambulatory Visit: Payer: Self-pay | Admitting: Nurse Practitioner

## 2022-01-31 DIAGNOSIS — E119 Type 2 diabetes mellitus without complications: Secondary | ICD-10-CM

## 2022-03-08 ENCOUNTER — Other Ambulatory Visit: Payer: Self-pay | Admitting: Nurse Practitioner

## 2022-03-08 DIAGNOSIS — E119 Type 2 diabetes mellitus without complications: Secondary | ICD-10-CM

## 2022-03-12 ENCOUNTER — Other Ambulatory Visit: Payer: Self-pay | Admitting: Nurse Practitioner

## 2022-03-12 DIAGNOSIS — E119 Type 2 diabetes mellitus without complications: Secondary | ICD-10-CM

## 2022-03-29 ENCOUNTER — Other Ambulatory Visit: Payer: Self-pay | Admitting: Nurse Practitioner

## 2022-04-02 ENCOUNTER — Encounter: Payer: Self-pay | Admitting: Family Medicine

## 2022-04-02 ENCOUNTER — Telehealth: Payer: Self-pay | Admitting: Nurse Practitioner

## 2022-04-02 ENCOUNTER — Ambulatory Visit (INDEPENDENT_AMBULATORY_CARE_PROVIDER_SITE_OTHER): Payer: 59 | Admitting: Family Medicine

## 2022-04-02 DIAGNOSIS — J329 Chronic sinusitis, unspecified: Secondary | ICD-10-CM | POA: Diagnosis not present

## 2022-04-02 DIAGNOSIS — J40 Bronchitis, not specified as acute or chronic: Secondary | ICD-10-CM | POA: Diagnosis not present

## 2022-04-02 MED ORDER — ONDANSETRON 8 MG PO TBDP: 8.0000 mg | ORAL_TABLET | Freq: Four times a day (QID) | ORAL | 1 refills | Status: DC | PRN

## 2022-04-02 MED ORDER — AMOXICILLIN-POT CLAVULANATE 875-125 MG PO TABS: 1.0000 | ORAL_TABLET | Freq: Two times a day (BID) | ORAL | 0 refills | Status: DC

## 2022-04-02 NOTE — Progress Notes (Signed)
Subjective:    Patient ID: Brittany Jenkins, female    DOB: 08-25-1965, 56 y.o.   MRN: [id number]   HPI: Brittany Jenkins is a 56 y.o. female presenting for onset 2 weeks ago of sinus pressure. Also productive cough. Not dyspneic. She is using advair as usual due to asthma. Hasn't needed the rescue albuterol. Covid test this AM was negative.   Couldn't sleep last night. This AM had shaking chill. Fever now is 103. Taking ondansetron for nausea. Feels it is caused by her metformin. She took an extra metformin last night due to glucose going to 300 after eating a cupcake. Now nausea is worse, but it has been present chronically for months.      06/16/2021    4:17 PM 08/08/2020   12:41 PM 11/23/2019    2:30 PM 07/25/2018    4:02 PM 05/16/2018    9:54 AM  Depression screen PHQ 2/9  Decreased Interest 0 0 0 0 0  Down, Depressed, Hopeless 0 0 0 0 0  PHQ - 2 Score 0 0 0 0 0  Altered sleeping 0      Tired, decreased energy 0      Change in appetite 0      Feeling bad or failure about yourself  0      Trouble concentrating 0      Moving slowly or fidgety/restless 0      Suicidal thoughts 0      PHQ-9 Score 0      Difficult doing work/chores Not difficult at all         Relevant past medical, surgical, family and social history reviewed and updated as indicated.  Interim medical history since our last visit reviewed. Allergies and medications reviewed and updated.  ROS:  Review of Systems  Constitutional:  Negative for activity change, appetite change, chills and fever.  HENT:  Positive for congestion, postnasal drip, rhinorrhea and sinus pressure. Negative for ear discharge, ear pain, hearing loss, nosebleeds, sneezing and trouble swallowing.   Respiratory:  Negative for chest tightness and shortness of breath.   Cardiovascular:  Negative for chest pain and palpitations.  Skin:  Negative for rash.     Social History   Tobacco Use  Smoking Status Former   Types: Cigarettes    Quit date: 05/18/1998   Years since quitting: 23.8  Smokeless Tobacco Never       Objective:     Wt Readings from Last 3 Encounters:  09/01/21 150 lb (68 kg)  07/28/21 153 lb 10.6 oz (69.7 kg)  07/04/21 152 lb 8.9 oz (69.2 kg)     Exam deferred. Pt. Harboring due to COVID 19. Phone visit performed.   Assessment & Plan:   1. Sinobronchitis     Meds ordered this encounter  Medications   amoxicillin-clavulanate (AUGMENTIN) 875-125 MG tablet    Sig: Take 1 tablet by mouth 2 (two) times daily. Take all of this medication    Dispense:  20 tablet    Refill:  0   ondansetron (ZOFRAN-ODT) 8 MG disintegrating tablet    Sig: Take 1 tablet (8 mg total) by mouth every 6 (six) hours as needed for nausea or vomiting.    Dispense:  20 tablet    Refill:  1    No orders of the defined types were placed in this encounter.     Diagnoses and all orders for this visit:  Sinobronchitis  Other orders -     amoxicillin-clavulanate (AUGMENTIN)  875-125 MG tablet; Take 1 tablet by mouth 2 (two) times daily. Take all of this medication -     ondansetron (ZOFRAN-ODT) 8 MG disintegrating tablet; Take 1 tablet (8 mg total) by mouth every 6 (six) hours as needed for nausea or vomiting.    Virtual Visit via telephone Note  I discussed the limitations, risks, security and privacy concerns of performing an evaluation and management service by telephone and the availability of in person appointments. The patient was identified with two identifiers. Pt.expressed understanding and agreed to proceed. Pt. Is at home. Dr. Darlyn Read is in his office.  Follow Up Instructions:   I discussed the assessment and treatment plan with the patient. The patient was provided an opportunity to ask questions and all were answered. The patient agreed with the plan and demonstrated an understanding of the instructions.   The patient was advised to call back or seek an in-person evaluation if the symptoms worsen or if the  condition fails to improve as anticipated.   Total minutes including chart review and phone contact time: 13  Report failure of fever to resolve with tylenol. Also follow up immediately for progression of dyspnea.  Follow up plan: Return if symptoms worsen or fail to improve, for soon with Paulene Floor to work on replacement for metformin.  Mechele Claude, MD [facility]

## 2022-04-03 ENCOUNTER — Telehealth: Payer: Self-pay | Admitting: Nurse Practitioner

## 2022-04-03 ENCOUNTER — Encounter: Payer: Self-pay | Admitting: Nurse Practitioner

## 2022-04-03 ENCOUNTER — Ambulatory Visit: Payer: 59 | Admitting: Nurse Practitioner

## 2022-04-03 VITALS — BP 149/78 | HR 120 | Temp 99.0°F | Resp 20 | Ht 66.0 in | Wt 152.0 lb

## 2022-04-03 DIAGNOSIS — E119 Type 2 diabetes mellitus without complications: Secondary | ICD-10-CM

## 2022-04-03 DIAGNOSIS — J40 Bronchitis, not specified as acute or chronic: Secondary | ICD-10-CM | POA: Diagnosis not present

## 2022-04-03 DIAGNOSIS — J329 Chronic sinusitis, unspecified: Secondary | ICD-10-CM

## 2022-04-03 MED ORDER — FLUCONAZOLE 150 MG PO TABS: 150.0000 mg | ORAL_TABLET | Freq: Once | ORAL | 0 refills | Status: AC

## 2022-04-03 MED ORDER — SITAGLIPTIN PHOSPHATE 100 MG PO TABS: 100.0000 mg | ORAL_TABLET | Freq: Every day | ORAL | 1 refills | Status: DC

## 2022-04-03 MED ORDER — AMOXICILLIN 875 MG PO TABS: 875.0000 mg | ORAL_TABLET | Freq: Two times a day (BID) | ORAL | 0 refills | Status: DC

## 2022-04-03 NOTE — Telephone Encounter (Signed)
Patients blood sugar is 276 and she has a fever. Please call back to advise.

## 2022-04-03 NOTE — Telephone Encounter (Signed)
Patient given appointment for today at 4:14 with MMM.

## 2022-04-03 NOTE — Progress Notes (Signed)
Subjective:    Patient ID: Brittany Jenkins, female    DOB: 1965/08/29, 56 y.o.   MRN: [id number]   Chief Complaint: Fever and Nausea   Fever  Pertinent negatives include no abdominal pain, chest pain, headaches or rash.   Patient comes in today c/o fever and nausea that started after starting augmantin. She had telephone visit yesterday with dr. Darlyn Read and was given augmentin for sinobronchitis. The augmenetin makes her sick. She also took 2 metformins and that made her sick and she is refusing  to take it anymore.     Review of Systems  Constitutional:  Positive for fever. Negative for diaphoresis.  Eyes:  Negative for pain.  Respiratory:  Negative for shortness of breath.   Cardiovascular:  Negative for chest pain, palpitations and leg swelling.  Gastrointestinal:  Negative for abdominal pain.  Endocrine: Negative for polydipsia.  Skin:  Negative for rash.  Neurological:  Negative for dizziness, weakness and headaches.  Hematological:  Does not bruise/bleed easily.  All other systems reviewed and are negative.      Objective:   Physical Exam Vitals and nursing note reviewed.  Constitutional:      General: She is not in acute distress.    Appearance: Normal appearance. She is well-developed.  HENT:     Head: Normocephalic.     Right Ear: Tympanic membrane normal.     Left Ear: Tympanic membrane normal.     Nose: Nose normal.     Mouth/Throat:     Mouth: Mucous membranes are moist.  Eyes:     Pupils: Pupils are equal, round, and reactive to light.  Neck:     Vascular: No carotid bruit or JVD.  Cardiovascular:     Rate and Rhythm: Normal rate and regular rhythm.     Heart sounds: Normal heart sounds.  Pulmonary:     Effort: Pulmonary effort is normal. No respiratory distress.     Breath sounds: Normal breath sounds. No wheezing or rales.  Chest:     Chest wall: No tenderness.  Abdominal:     General: Bowel sounds are normal. There is no distension or abdominal  bruit.     Palpations: Abdomen is soft. There is no hepatomegaly, splenomegaly, mass or pulsatile mass.     Tenderness: There is no abdominal tenderness.  Musculoskeletal:        General: Normal range of motion.     Cervical back: Normal range of motion and neck supple.  Lymphadenopathy:     Cervical: No cervical adenopathy.  Skin:    General: Skin is warm and dry.  Neurological:     Mental Status: She is alert and oriented to person, place, and time.     Deep Tendon Reflexes: Reflexes are normal and symmetric.  Psychiatric:        Behavior: Behavior normal.        Thought Content: Thought content normal.        Judgment: Judgment normal.     BP (!) 149/78   Pulse (!) 120   Temp 99 F (37.2 C) (Temporal)   Resp 20   Ht 5\' 6"  (1.676 m)   Wt 152 lb (68.9 kg)   LMP 09/07/2015   SpO2 90%   BMI 24.53 kg/m        Assessment & Plan:   Brittany Jenkins in today with chief complaint of Fever and Nausea   1. Type 2 diabetes mellitus without complication, without long-term current use of insulin (HCC)  Stop janumet Continue to watch carbs' Continue glipizide - sitaGLIPtin (JANUVIA) 100 MG tablet; Take 1 tablet (100 mg total) by mouth daily.  Dispense: 90 tablet; Refill: 1  2. Sinobronchitis 1. Take meds as prescribed 2. Use a cool mist humidifier especially during the winter months and when heat has been humid. 3. Use saline nose sprays frequently 4. Saline irrigations of the nose can be very helpful if done frequently.  * 4X daily for 1 week*  * Use of a nettie pot can be helpful with this. Follow directions with this* 5. Drink plenty of fluids 6. Keep thermostat turn down low 7.For any cough or congestion- mucinex 8. For fever or aces or pains- take tylenol or ibuprofen appropriate for age and weight.  * for fevers greater than 101 orally you may alternate ibuprofen and tylenol every  3 hours.    - amoxicillin (AMOXIL) 875 MG tablet; Take 1 tablet (875 mg total) by  mouth 2 (two) times daily for 10 days.  Dispense: 20 tablet; Refill: 0 - fluconazole (DIFLUCAN) 150 MG tablet; Take 1 tablet (150 mg total) by mouth once for 1 dose.  Dispense: 1 tablet; Refill: 0    The above assessment and management plan was discussed with the patient. The patient verbalized understanding of and has agreed to the management plan. Patient is aware to call the clinic if symptoms persist or worsen. Patient is aware when to return to the clinic for a follow-up visit. Patient educated on when it is appropriate to go to the emergency department.   Mary-Margaret Hassell Done, FNP

## 2022-04-03 NOTE — Patient Instructions (Signed)
1. Take meds as prescribed 2. Use a cool mist humidifier especially during the winter months and when heat has been humid. 3. Use saline nose sprays frequently 4. Saline irrigations of the nose can be very helpful if done frequently.  * 4X daily for 1 week*  * Use of a nettie pot can be helpful with this. Follow directions with this* 5. Drink plenty of fluids 6. Keep thermostat turn down low 7.For any cough or congestion- mucinex OTC 8. For fever or aces or pains- take tylenol or ibuprofen appropriate for age and weight.  * for fevers greater than 101 orally you may alternate ibuprofen and tylenol every  3 hours.    

## 2022-04-05 ENCOUNTER — Other Ambulatory Visit: Payer: Self-pay | Admitting: Family Medicine

## 2022-04-05 MED ORDER — CEFUROXIME AXETIL 250 MG PO TABS: 250.0000 mg | ORAL_TABLET | Freq: Two times a day (BID) | ORAL | 0 refills | Status: AC

## 2022-04-05 NOTE — Telephone Encounter (Signed)
Please let the patient know that I sent their prescription to their pharmacy. Thanks, WS 

## 2022-04-06 DIAGNOSIS — J4520 Mild intermittent asthma, uncomplicated: Secondary | ICD-10-CM

## 2022-04-06 MED ORDER — ALBUTEROL SULFATE HFA 108 (90 BASE) MCG/ACT IN AERS: INHALATION_SPRAY | RESPIRATORY_TRACT | 0 refills | Status: DC

## 2022-04-06 MED ORDER — FLUCONAZOLE 150 MG PO TABS: 150.0000 mg | ORAL_TABLET | Freq: Once | ORAL | 0 refills | Status: AC

## 2022-04-06 NOTE — Telephone Encounter (Signed)
Patient seen in office since telephone call

## 2022-04-06 NOTE — Telephone Encounter (Signed)
Is ent in prescriptions requested. Will have to stay in janumet intil we can get prior auth for [place]. Need to be on something to keep blood sugars down.  Meds ordered this encounter  Medications   fluconazole (DIFLUCAN) 150 MG tablet    Sig: Take 1 tablet (150 mg total) by mouth once for 1 dose.    Dispense:  1 tablet    Refill:  0    Order Specific Question:   Supervising Provider    Answer:   Arville Care A [[id number]]   albuterol (VENTOLIN HFA) 108 (90 Base) MCG/ACT inhaler    Sig: INHALE 2 PUFFS EVERY 4 TO 6 HOURS AS A RESCUE FOR ASTHMA (NEEDS TO BE SEEN BEFORE NEXT REFILL)    Dispense:  18 each    Refill:  0    Order Specific Question:   Supervising Provider    Answer:   Nils Pyle [[id number]]   Mary-Margaret Daphine Deutscher, FNP

## 2022-04-07 ENCOUNTER — Telehealth: Payer: Self-pay | Admitting: Nurse Practitioner

## 2022-04-07 NOTE — Telephone Encounter (Signed)
PA for [facility] Process  Key: [id number]   PA Case ID: [id number]   Your information has been submitted to [employer]. To check for an updated outcome later, reopen this PA request from your dashboard.  If [employer] has not responded to your request within 24 hours, contact [employer] at [phone]. If you think there may be a problem with your PA request, use our live chat feature at the bottom right.

## 2022-04-08 NOTE — Telephone Encounter (Signed)
Sheppard [place]: [id number] - PA Case ID: [id number] - Rx #: [id number] Need help? Call us at [phone] Outcome Approvedon November 21 Your PA request has been approved. Additional information will be provided in the approval communication. (Message 1145) Drug Januvia 100MG  tablets  Pharmacy informed

## 2022-04-10 ENCOUNTER — Other Ambulatory Visit: Payer: Self-pay | Admitting: Nurse Practitioner

## 2022-04-10 DIAGNOSIS — E119 Type 2 diabetes mellitus without complications: Secondary | ICD-10-CM

## 2022-04-15 ENCOUNTER — Other Ambulatory Visit: Payer: Self-pay | Admitting: Nurse Practitioner

## 2022-04-15 DIAGNOSIS — I152 Hypertension secondary to endocrine disorders: Secondary | ICD-10-CM

## 2022-04-28 ENCOUNTER — Other Ambulatory Visit: Payer: Self-pay | Admitting: Nurse Practitioner

## 2022-04-28 DIAGNOSIS — J4520 Mild intermittent asthma, uncomplicated: Secondary | ICD-10-CM

## 2022-06-01 ENCOUNTER — Other Ambulatory Visit: Payer: Self-pay | Admitting: Nurse Practitioner

## 2022-06-01 DIAGNOSIS — J4520 Mild intermittent asthma, uncomplicated: Secondary | ICD-10-CM

## 2022-06-25 ENCOUNTER — Other Ambulatory Visit: Payer: Self-pay | Admitting: Nurse Practitioner

## 2022-06-25 DIAGNOSIS — E119 Type 2 diabetes mellitus without complications: Secondary | ICD-10-CM

## 2022-06-30 ENCOUNTER — Other Ambulatory Visit: Payer: Self-pay | Admitting: Nurse Practitioner

## 2022-06-30 DIAGNOSIS — K219 Gastro-esophageal reflux disease without esophagitis: Secondary | ICD-10-CM

## 2022-06-30 DIAGNOSIS — J4520 Mild intermittent asthma, uncomplicated: Secondary | ICD-10-CM

## 2022-07-14 ENCOUNTER — Other Ambulatory Visit: Payer: Self-pay | Admitting: Nurse Practitioner

## 2022-07-14 DIAGNOSIS — K219 Gastro-esophageal reflux disease without esophagitis: Secondary | ICD-10-CM

## 2022-07-14 NOTE — Telephone Encounter (Signed)
MMM NTBS 30 days given 06/30/22

## 2022-07-14 NOTE — Telephone Encounter (Signed)
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

## 2022-07-20 ENCOUNTER — Other Ambulatory Visit: Payer: Self-pay | Admitting: Nurse Practitioner

## 2022-07-20 DIAGNOSIS — J4520 Mild intermittent asthma, uncomplicated: Secondary | ICD-10-CM

## 2022-07-21 ENCOUNTER — Other Ambulatory Visit: Payer: Self-pay | Admitting: Nurse Practitioner

## 2022-07-21 DIAGNOSIS — E119 Type 2 diabetes mellitus without complications: Secondary | ICD-10-CM

## 2022-07-21 NOTE — Telephone Encounter (Signed)
MMM NTBS 30 days given 06/25/22

## 2022-07-22 NOTE — Telephone Encounter (Signed)
Pt scheduled apt for 09/17/2022. Pt cannot come until then due to work schedule. Can she get a refill?

## 2022-07-23 MED ORDER — GLIPIZIDE 10 MG PO TABS: 10.0000 mg | ORAL_TABLET | Freq: Every day | ORAL | 1 refills | Status: DC

## 2022-07-23 NOTE — Addendum Note (Signed)
Addended by: Antonietta Barcelona D on: 07/23/2022 04:12 PM   Modules accepted: Orders

## 2022-08-06 ENCOUNTER — Other Ambulatory Visit: Payer: Self-pay | Admitting: Nurse Practitioner

## 2022-08-06 DIAGNOSIS — K219 Gastro-esophageal reflux disease without esophagitis: Secondary | ICD-10-CM

## 2022-08-25 ENCOUNTER — Other Ambulatory Visit: Payer: Self-pay | Admitting: Nurse Practitioner

## 2022-08-25 DIAGNOSIS — K219 Gastro-esophageal reflux disease without esophagitis: Secondary | ICD-10-CM

## 2022-08-25 DIAGNOSIS — J4520 Mild intermittent asthma, uncomplicated: Secondary | ICD-10-CM

## 2022-09-14 ENCOUNTER — Other Ambulatory Visit: Payer: Self-pay | Admitting: Nurse Practitioner

## 2022-09-14 DIAGNOSIS — E1159 Type 2 diabetes mellitus with other circulatory complications: Secondary | ICD-10-CM

## 2022-09-17 ENCOUNTER — Ambulatory Visit: Payer: 59 | Admitting: Nurse Practitioner

## 2022-10-12 ENCOUNTER — Other Ambulatory Visit: Payer: Self-pay | Admitting: Nurse Practitioner

## 2022-10-12 DIAGNOSIS — J4520 Mild intermittent asthma, uncomplicated: Secondary | ICD-10-CM

## 2022-10-21 ENCOUNTER — Encounter: Payer: Self-pay | Admitting: Family Medicine

## 2022-10-21 ENCOUNTER — Other Ambulatory Visit: Payer: Self-pay | Admitting: Nurse Practitioner

## 2022-10-21 ENCOUNTER — Ambulatory Visit (INDEPENDENT_AMBULATORY_CARE_PROVIDER_SITE_OTHER): Payer: 59 | Admitting: Family Medicine

## 2022-10-21 ENCOUNTER — Telehealth: Payer: Self-pay | Admitting: Nurse Practitioner

## 2022-10-21 VITALS — BP 161/86 | HR 86 | Temp 97.6°F | Ht 66.0 in | Wt 149.2 lb

## 2022-10-21 DIAGNOSIS — J0110 Acute frontal sinusitis, unspecified: Secondary | ICD-10-CM | POA: Diagnosis not present

## 2022-10-21 DIAGNOSIS — I152 Hypertension secondary to endocrine disorders: Secondary | ICD-10-CM | POA: Diagnosis not present

## 2022-10-21 DIAGNOSIS — E1159 Type 2 diabetes mellitus with other circulatory complications: Secondary | ICD-10-CM

## 2022-10-21 DIAGNOSIS — M25571 Pain in right ankle and joints of right foot: Secondary | ICD-10-CM | POA: Diagnosis not present

## 2022-10-21 DIAGNOSIS — M79671 Pain in right foot: Secondary | ICD-10-CM | POA: Diagnosis not present

## 2022-10-21 DIAGNOSIS — Z7984 Long term (current) use of oral hypoglycemic drugs: Secondary | ICD-10-CM

## 2022-10-21 DIAGNOSIS — S92411A Displaced fracture of proximal phalanx of right great toe, initial encounter for closed fracture: Secondary | ICD-10-CM | POA: Diagnosis not present

## 2022-10-21 MED ORDER — METFORMIN HCL ER 500 MG PO TB24: 500.0000 mg | ORAL_TABLET | Freq: Two times a day (BID) | ORAL | 2 refills | Status: DC

## 2022-10-21 MED ORDER — LISINOPRIL 20 MG PO TABS: 20.0000 mg | ORAL_TABLET | Freq: Every day | ORAL | 0 refills | Status: DC

## 2022-10-21 MED ORDER — AMLODIPINE BESYLATE 5 MG PO TABS: 5.0000 mg | ORAL_TABLET | Freq: Every day | ORAL | 5 refills | Status: DC

## 2022-10-21 NOTE — Telephone Encounter (Signed)
Per My chart message Do I take the Lisinopril with the blood pressure medicine prescribed today? Do I take both? And if yes i need the a refill for the Lisinopril

## 2022-10-21 NOTE — Progress Notes (Signed)
Subjective:  Patient ID: Brittany Jenkins, female    DOB: [birth year]  Age: 56 y.o. MRN: [id number]  CC: Hypertension   HPI Brittany Jenkins presents for  follow-up of hypertension. Pt. Took 40 mg of lisinopril yesterdayPatient has no history of headache chest pain or shortness of breath or recent cough. Patient also denies symptoms of TIA such as focal numbness or weakness. Patient denies side effects from medication. States taking it regularly.  Had to cut metformin in half due to intolerance. History [place] has a past medical history of Asthma, Diabetes mellitus without complication (HCC), GERD (gastroesophageal reflux disease), Hyperlipidemia, Hypertension, and Microalbuminuria.   She has a past surgical history that includes Hernia repair.   Her family history includes Arthritis in her father and mother; Asthma in her mother; Diabetes in her father and mother; GER disease in her father; Hyperlipidemia in her father and mother; Hypertension in her father and mother.She reports that she quit smoking about 24 years ago. Her smoking use included cigarettes. She has never used smokeless tobacco. She reports that she does not currently use alcohol. She reports that she does not use drugs.  Current Outpatient Medications on File Prior to Visit  Medication Sig Dispense Refill   albuterol (VENTOLIN HFA) 108 (90 Base) MCG/ACT inhaler INHALE 2 PUFFS EVERY 4 TO 6 HOURS AS A RESCUE FOR ASTHMA (NEEDS TO BE SEEN BEFORE NEXT REFILL) 18 each 2   aspirin 81 MG chewable tablet Chew by mouth daily.     atorvastatin (LIPITOR) 20 MG tablet Take 1 tablet (20 mg total) by mouth daily. 90 tablet 1   diphenhydrAMINE (BENADRYL) 25 mg capsule Take 25 mg by mouth every other day.     famotidine (PEPCID) 20 MG tablet TAKE 1 TABLET BY MOUTH TWICE A DAY 180 tablet 0   Ferrous Sulfate (IRON PO) Take by mouth.     fluticasone-salmeterol (WIXELA INHUB) 250-50 MCG/ACT AEPB Inhale 1 puff into the lungs 2 (two) times  daily. in the morning and at bedtime. (NEEDS TO BE SEEN BEFORE NEXT REFILL) 60 each 0   glipiZIDE (GLUCOTROL) 10 MG tablet Take 1 tablet (10 mg total) by mouth daily before breakfast. 60 tablet 1   Multiple Vitamin (MULTIVITAMIN PO) Take by mouth.     omeprazole (PRILOSEC) 20 MG capsule Take 1 capsule (20 mg total) by mouth 2 (two) times daily before a meal. 180 capsule 1   ondansetron (ZOFRAN-ODT) 8 MG disintegrating tablet Take 1 tablet (8 mg total) by mouth every 6 (six) hours as needed for nausea or vomiting. 20 tablet 1   sitaGLIPtin (JANUVIA) 100 MG tablet Take 1 tablet (100 mg total) by mouth daily. 90 tablet 1   No current facility-administered medications on file prior to visit.    ROS Review of Systems  Constitutional: Negative.   HENT:  Positive for congestion.   Eyes:  Negative for visual disturbance.  Respiratory:  Negative for shortness of breath.   Cardiovascular:  Negative for chest pain.  Gastrointestinal:  Negative for abdominal pain.  Musculoskeletal:  Positive for gait problem (foot pain). Negative for arthralgias.    Objective:  BP (!) 161/86   Pulse 86   Temp 97.6 F (36.4 C)   Ht 5\' 6"  (1.676 m)   Wt 149 lb 3.2 oz (67.7 kg)   LMP 09/07/2015   SpO2 93%   BMI 24.08 kg/m   BP Readings from Last 3 Encounters:  10/21/22 (!) 161/86  04/03/22 (!) 149/78  09/01/21 133/82  Wt Readings from Last 3 Encounters:  10/21/22 149 lb 3.2 oz (67.7 kg)  04/03/22 152 lb (68.9 kg)  09/01/21 150 lb (68 kg)     Physical Exam Constitutional:      General: She is not in acute distress.    Appearance: She is well-developed. She is ill-appearing.  Cardiovascular:     Rate and Rhythm: Normal rate and regular rhythm.  Pulmonary:     Breath sounds: Normal breath sounds.  Musculoskeletal:        General: Normal range of motion.  Skin:    General: Skin is warm and dry.  Neurological:     Mental Status: She is alert and oriented to person, place, and time.        Assessment & Plan:   Brittany Jenkins was seen today for hypertension.  Diagnoses and all orders for this visit:  Hypertension associated with type 2 diabetes mellitus (HCC) -     lisinopril (ZESTRIL) 20 MG tablet; Take 1 tablet (20 mg total) by mouth daily.  Other orders -     metFORMIN (GLUCOPHAGE-XR) 500 MG 24 hr tablet; Take 1 tablet (500 mg total) by mouth 2 (two) times daily with a meal. -     amLODipine (NORVASC) 5 MG tablet; Take 1 tablet (5 mg total) by mouth daily. For blood pressure   Allergies as of 10/21/2022       Reactions   Erythromycin Nausea And Vomiting   Severe vomiting   Metformin And Related Nausea And Vomiting   Victoza [liraglutide] Diarrhea, Nausea Only        Medication List        Accurate as of October 21, 2022  5:57 PM. If you have any questions, ask your nurse or doctor.          albuterol 108 (90 Base) MCG/ACT inhaler Commonly known as: VENTOLIN HFA INHALE 2 PUFFS EVERY 4 TO 6 HOURS AS A RESCUE FOR ASTHMA (NEEDS TO BE SEEN BEFORE NEXT REFILL)   amLODipine 5 MG tablet Commonly known as: NORVASC Take 1 tablet (5 mg total) by mouth daily. For blood pressure Started by: Mechele Claude, MD   aspirin 81 MG chewable tablet Chew by mouth daily.   atorvastatin 20 MG tablet Commonly known as: LIPITOR Take 1 tablet (20 mg total) by mouth daily.   diphenhydrAMINE 25 mg capsule Commonly known as: BENADRYL Take 25 mg by mouth every other day.   famotidine 20 MG tablet Commonly known as: PEPCID TAKE 1 TABLET BY MOUTH TWICE A DAY   fluticasone-salmeterol 250-50 MCG/ACT Aepb Commonly known as: Wixela Inhub Inhale 1 puff into the lungs 2 (two) times daily. in the morning and at bedtime. (NEEDS TO BE SEEN BEFORE NEXT REFILL)   glipiZIDE 10 MG tablet Commonly known as: GLUCOTROL Take 1 tablet (10 mg total) by mouth daily before breakfast.   IRON PO Take by mouth.   lisinopril 20 MG tablet Commonly known as: ZESTRIL Take 1 tablet (20 mg  total) by mouth daily. What changed: additional instructions Changed by: Mechele Claude, MD   metFORMIN 500 MG 24 hr tablet Commonly known as: GLUCOPHAGE-XR Take 1 tablet (500 mg total) by mouth 2 (two) times daily with a meal. Started by: Mechele Claude, MD   MULTIVITAMIN PO Take by mouth.   omeprazole 20 MG capsule Commonly known as: PRILOSEC Take 1 capsule (20 mg total) by mouth 2 (two) times daily before a meal.   ondansetron 8 MG disintegrating tablet Commonly known as:  ZOFRAN-ODT Take 1 tablet (8 mg total) by mouth every 6 (six) hours as needed for nausea or vomiting.   sitaGLIPtin 100 MG tablet Commonly known as: Januvia Take 1 tablet (100 mg total) by mouth daily.        Meds ordered this encounter  Medications   metFORMIN (GLUCOPHAGE-XR) 500 MG 24 hr tablet    Sig: Take 1 tablet (500 mg total) by mouth 2 (two) times daily with a meal.    Dispense:  60 tablet    Refill:  2   amLODipine (NORVASC) 5 MG tablet    Sig: Take 1 tablet (5 mg total) by mouth daily. For blood pressure    Dispense:  30 tablet    Refill:  5   lisinopril (ZESTRIL) 20 MG tablet    Sig: Take 1 tablet (20 mg total) by mouth daily.    Dispense:  90 tablet    Refill:  0      Follow-up: Return in about 1 month (around 11/20/2022) for with PCP, hypertension, diabetes.  Mechele Claude, M.D.

## 2022-10-21 NOTE — Telephone Encounter (Signed)
Please let the patient know that I sent their prescription to their pharmacy. Thanks, WS 

## 2022-10-22 NOTE — Telephone Encounter (Signed)
Done through mychart.

## 2022-10-24 ENCOUNTER — Telehealth: Payer: 59 | Admitting: Nurse Practitioner

## 2022-10-24 DIAGNOSIS — J4520 Mild intermittent asthma, uncomplicated: Secondary | ICD-10-CM

## 2022-10-24 DIAGNOSIS — Z760 Encounter for issue of repeat prescription: Secondary | ICD-10-CM

## 2022-10-24 MED ORDER — FLUTICASONE-SALMETEROL 250-50 MCG/ACT IN AEPB
1.0000 | INHALATION_SPRAY | Freq: Two times a day (BID) | RESPIRATORY_TRACT | 0 refills | Status: DC
Start: 2022-10-24 — End: 2022-12-18

## 2022-10-24 MED ORDER — ALBUTEROL SULFATE HFA 108 (90 BASE) MCG/ACT IN AERS
2.0000 | INHALATION_SPRAY | Freq: Four times a day (QID) | RESPIRATORY_TRACT | 2 refills | Status: DC | PRN
Start: 2022-10-24 — End: 2023-02-05

## 2022-10-24 NOTE — Progress Notes (Signed)
E-Visits are not used to request refills.  After reviewing your records, I can verify that you may be running out of a long term medication before your next scheduled appointment.  Based on this information, I can refill your (provider, please free text) on a one time basis.    Please contact your doctor as soon as possible to manage your prescription.   Meds ordered this encounter  Medications   fluticasone-salmeterol (WIXELA INHUB) 250-50 MCG/ACT AEPB    Sig: Inhale 1 puff into the lungs 2 (two) times daily. in the morning and at bedtime. (NEEDS TO BE SEEN BEFORE NEXT REFILL)    Dispense:  60 each    Refill:  0    Order Specific Question:   Supervising Provider    Answer:   Loreli Dollar   albuterol (VENTOLIN HFA) 108 (90 Base) MCG/ACT inhaler    Sig: Inhale 2 puffs into the lungs every 6 (six) hours as needed for wheezing or shortness of breath.    Dispense:  18 each    Refill:  2    Order Specific Question:   Supervising Provider    Answer:   Merrilee Jansky [[id number]]   Brittany Daphine Deutscher, FNP   5-10 minutes spent reviewing and documenting in chart.

## 2022-10-28 ENCOUNTER — Other Ambulatory Visit: Payer: Self-pay | Admitting: Nurse Practitioner

## 2022-10-28 DIAGNOSIS — K219 Gastro-esophageal reflux disease without esophagitis: Secondary | ICD-10-CM

## 2022-11-02 ENCOUNTER — Ambulatory Visit: Payer: 59 | Admitting: Family Medicine

## 2022-12-14 ENCOUNTER — Other Ambulatory Visit: Payer: Self-pay | Admitting: Nurse Practitioner

## 2022-12-14 DIAGNOSIS — E119 Type 2 diabetes mellitus without complications: Secondary | ICD-10-CM

## 2022-12-15 ENCOUNTER — Other Ambulatory Visit: Payer: Self-pay | Admitting: Nurse Practitioner

## 2022-12-15 DIAGNOSIS — K219 Gastro-esophageal reflux disease without esophagitis: Secondary | ICD-10-CM

## 2022-12-17 ENCOUNTER — Other Ambulatory Visit: Payer: Self-pay | Admitting: Nurse Practitioner

## 2022-12-17 DIAGNOSIS — J4520 Mild intermittent asthma, uncomplicated: Secondary | ICD-10-CM

## 2022-12-19 ENCOUNTER — Other Ambulatory Visit: Payer: Self-pay | Admitting: Nurse Practitioner

## 2022-12-19 DIAGNOSIS — K219 Gastro-esophageal reflux disease without esophagitis: Secondary | ICD-10-CM

## 2022-12-21 ENCOUNTER — Encounter: Payer: Self-pay | Admitting: Nurse Practitioner

## 2022-12-21 NOTE — Telephone Encounter (Signed)
LMTCB to schedule appt Letter mailed 

## 2022-12-21 NOTE — Telephone Encounter (Signed)
MMM NTBS 30 day given 11/22/22

## 2022-12-22 ENCOUNTER — Other Ambulatory Visit: Payer: Self-pay | Admitting: Nurse Practitioner

## 2022-12-22 DIAGNOSIS — Z1231 Encounter for screening mammogram for malignant neoplasm of breast: Secondary | ICD-10-CM

## 2022-12-24 ENCOUNTER — Other Ambulatory Visit: Payer: Self-pay | Admitting: Nurse Practitioner

## 2022-12-24 DIAGNOSIS — E119 Type 2 diabetes mellitus without complications: Secondary | ICD-10-CM

## 2023-01-13 ENCOUNTER — Other Ambulatory Visit: Payer: Self-pay | Admitting: Family Medicine

## 2023-01-13 ENCOUNTER — Ambulatory Visit
Admission: RE | Admit: 2023-01-13 | Discharge: 2023-01-13 | Disposition: A | Discharge status: Home / Self Care | Payer: 59 | Source: Ambulatory Visit | Attending: Nurse Practitioner | Admitting: Nurse Practitioner

## 2023-01-13 DIAGNOSIS — Z1231 Encounter for screening mammogram for malignant neoplasm of breast: Secondary | ICD-10-CM

## 2023-01-16 ENCOUNTER — Other Ambulatory Visit: Payer: Self-pay | Admitting: Nurse Practitioner

## 2023-01-16 DIAGNOSIS — E119 Type 2 diabetes mellitus without complications: Secondary | ICD-10-CM

## 2023-01-30 ENCOUNTER — Other Ambulatory Visit: Payer: Self-pay | Admitting: Family Medicine

## 2023-01-30 DIAGNOSIS — E1159 Type 2 diabetes mellitus with other circulatory complications: Secondary | ICD-10-CM

## 2023-02-04 ENCOUNTER — Other Ambulatory Visit: Payer: Self-pay | Admitting: Family Medicine

## 2023-02-04 NOTE — Telephone Encounter (Signed)
Stacks pt NTBS 30-d given 01/13/23

## 2023-02-04 NOTE — Telephone Encounter (Signed)
Left message for pt to call back to schedule appt

## 2023-02-05 ENCOUNTER — Other Ambulatory Visit: Payer: Self-pay | Admitting: Nurse Practitioner

## 2023-02-05 DIAGNOSIS — J4520 Mild intermittent asthma, uncomplicated: Secondary | ICD-10-CM

## 2023-02-06 ENCOUNTER — Other Ambulatory Visit: Payer: Self-pay | Admitting: Family Medicine

## 2023-02-08 ENCOUNTER — Encounter: Payer: Self-pay | Admitting: Nurse Practitioner

## 2023-02-08 NOTE — Telephone Encounter (Signed)
MMM pt NTBS 30-d given 01/11/23

## 2023-02-08 NOTE — Telephone Encounter (Signed)
Called to schedule appt, no answer. Left message and sent letter.

## 2023-02-15 DIAGNOSIS — L723 Sebaceous cyst: Secondary | ICD-10-CM | POA: Diagnosis not present

## 2023-02-15 DIAGNOSIS — Z01419 Encounter for gynecological examination (general) (routine) without abnormal findings: Secondary | ICD-10-CM | POA: Diagnosis not present

## 2023-02-15 DIAGNOSIS — Z6823 Body mass index (BMI) 23.0-23.9, adult: Secondary | ICD-10-CM | POA: Diagnosis not present

## 2023-02-15 DIAGNOSIS — N952 Postmenopausal atrophic vaginitis: Secondary | ICD-10-CM | POA: Diagnosis not present

## 2023-02-15 DIAGNOSIS — N951 Menopausal and female climacteric states: Secondary | ICD-10-CM | POA: Diagnosis not present

## 2023-02-15 DIAGNOSIS — Z1331 Encounter for screening for depression: Secondary | ICD-10-CM | POA: Diagnosis not present

## 2023-02-15 DIAGNOSIS — N941 Unspecified dyspareunia: Secondary | ICD-10-CM | POA: Diagnosis not present

## 2023-02-15 DIAGNOSIS — Z1151 Encounter for screening for human papillomavirus (HPV): Secondary | ICD-10-CM | POA: Diagnosis not present

## 2023-02-19 ENCOUNTER — Encounter: Payer: Self-pay | Admitting: Physician Assistant

## 2023-02-19 ENCOUNTER — Telehealth: Payer: 59 | Admitting: Physician Assistant

## 2023-02-19 DIAGNOSIS — B9689 Other specified bacterial agents as the cause of diseases classified elsewhere: Secondary | ICD-10-CM

## 2023-02-19 DIAGNOSIS — J4531 Mild persistent asthma with (acute) exacerbation: Secondary | ICD-10-CM

## 2023-02-19 DIAGNOSIS — J069 Acute upper respiratory infection, unspecified: Secondary | ICD-10-CM

## 2023-02-19 MED ORDER — AMOXICILLIN-POT CLAVULANATE 875-125 MG PO TABS
1.0000 | ORAL_TABLET | Freq: Two times a day (BID) | ORAL | 0 refills | Status: DC
Start: 2023-02-19 — End: 2023-10-28

## 2023-02-19 MED ORDER — AMOXICILLIN 875 MG PO TABS
875.0000 mg | ORAL_TABLET | Freq: Two times a day (BID) | ORAL | 0 refills | Status: AC
Start: 2023-02-19 — End: 2023-03-01

## 2023-02-19 MED ORDER — AMOXICILLIN 875 MG PO TABS: 875.0000 mg | ORAL_TABLET | Freq: Two times a day (BID) | ORAL | 0 refills | Status: DC

## 2023-02-19 NOTE — Addendum Note (Signed)
Addended by: Margaretann Loveless on: 02/19/2023 12:03 PM   Modules accepted: Orders

## 2023-02-19 NOTE — Patient Instructions (Signed)
[facility], thank you for joining Margaretann Loveless, PA-C for today's virtual visit.  While this provider is not your primary care provider (PCP), if your PCP is located in our provider database this encounter information will be shared with them immediately following your visit.   A [facility] MyChart account gives you access to today's visit and all your visits, tests, and labs performed at [facility] " click here if you don't have a [facility] MyChart account or go to [email or web address]  Consent: (Patient) Brittany Jenkins provided verbal consent for this virtual visit at the beginning of the encounter.  Current Medications:  Current Outpatient Medications:    amoxicillin-clavulanate (AUGMENTIN) 875-125 MG tablet, Take 1 tablet by mouth 2 (two) times daily., Disp: 20 tablet, Rfl: 0   albuterol (VENTOLIN HFA) 108 (90 Base) MCG/ACT inhaler, Inhale 2 puffs into the lungs every 6 (six) hours as needed for wheezing or shortness of breath. **NEEDS TO BE SEEN BEFORE NEXT REFILL**, Disp: 18 each, Rfl: 0   amLODipine (NORVASC) 5 MG tablet, Take 1 tablet (5 mg total) by mouth daily. For blood pressure, Disp: 30 tablet, Rfl: 5   aspirin 81 MG chewable tablet, Chew by mouth daily., Disp: , Rfl:    atorvastatin (LIPITOR) 20 MG tablet, Take 1 tablet (20 mg total) by mouth daily., Disp: 90 tablet, Rfl: 1   diphenhydrAMINE (BENADRYL) 25 mg capsule, Take 25 mg by mouth every other day., Disp: , Rfl:    famotidine (PEPCID) 20 MG tablet, Take 1 tablet (20 mg total) by mouth 2 (two) times daily. (NEEDS TO BE SEEN BEFORE NEXT REFILL), Disp: 60 tablet, Rfl: 0   Ferrous Sulfate (IRON PO), Take by mouth., Disp: , Rfl:    fluticasone-salmeterol (WIXELA INHUB) 250-50 MCG/ACT AEPB, Inhale 1 puff into the lungs in the morning and at bedtime., Disp: 180 each, Rfl: 0   glipiZIDE (GLUCOTROL) 10 MG tablet, Take 1 tablet (10 mg total) by mouth daily before breakfast. **NEEDS TO BE SEEN BEFORE NEXT  REFILL**, Disp: 60 tablet, Rfl: 0   lisinopril (ZESTRIL) 20 MG tablet, TAKE 1 TABLET BY MOUTH EVERY DAY, Disp: 30 tablet, Rfl: 2   metFORMIN (GLUCOPHAGE-XR) 500 MG 24 hr tablet, Take 1 tablet (500 mg total) by mouth 2 (two) times daily with a meal. **NEEDS TO BE SEEN BEFORE NEXT REFILL**, Disp: 60 tablet, Rfl: 0   Multiple Vitamin (MULTIVITAMIN PO), Take by mouth., Disp: , Rfl:    omeprazole (PRILOSEC) 20 MG capsule, Take 1 capsule (20 mg total) by mouth 2 (two) times daily before a meal., Disp: 180 capsule, Rfl: 1   ondansetron (ZOFRAN-ODT) 8 MG disintegrating tablet, Take 1 tablet (8 mg total) by mouth every 6 (six) hours as needed for nausea or vomiting., Disp: 20 tablet, Rfl: 1   sitaGLIPtin (JANUVIA) 100 MG tablet, TAKE 1 TABLET BY MOUTH EVERY DAY, Disp: 90 tablet, Rfl: 0   Medications ordered in this encounter:  Meds ordered this encounter  Medications   amoxicillin-clavulanate (AUGMENTIN) 875-125 MG tablet    Sig: Take 1 tablet by mouth 2 (two) times daily.    Dispense:  20 tablet    Refill:  0    Order Specific Question:   Supervising Provider    Answer:   Merrilee Jansky [id number]     *If you need refills on other medications prior to your next appointment, please contact your pharmacy*  Follow-Up: Call back or seek an in-person evaluation if the symptoms worsen or if the  condition fails to improve as anticipated.  [facility] [phone]  Other Instructions Acute Bronchitis, Adult  Acute bronchitis is sudden inflammation of the main airways (bronchi) that come off the windpipe (trachea) in the lungs. The swelling causes the airways to get smaller and make more mucus than normal. This can make it hard to breathe and can cause coughing or noisy breathing (wheezing). Acute bronchitis may last several weeks. The cough may last longer. Allergies, asthma, and exposure to smoke may make the condition worse. What are the causes? This condition can be caused by  germs and by substances that irritate the lungs, including: Cold and flu viruses. The most common cause of this condition is the virus that causes the common cold. Bacteria. This is less common. Breathing in substances that irritate the lungs, including: Smoke from cigarettes and other forms of tobacco. Dust and pollen. Fumes from household cleaning products, gases, or burned fuel. Indoor or outdoor air pollution. What increases the risk? The following factors may make you more likely to develop this condition: A weak body's defense system, also called the immune system. A condition that affects your lungs and breathing, such as asthma. What are the signs or symptoms? Common symptoms of this condition include: Coughing. This may bring up clear, yellow, or green mucus from your lungs (sputum). Wheezing. Runny or stuffy nose. Having too much mucus in your lungs (chest congestion). Shortness of breath. Aches and pains, including sore throat or chest. How is this diagnosed? This condition is usually diagnosed based on: Your symptoms and medical history. A physical exam. You may also have other tests, including tests to rule out other conditions, such as pneumonia. These tests include: A test of lung function. Test of a mucus sample to look for the presence of bacteria. Tests to check the oxygen level in your blood. Blood tests. Chest X-ray. How is this treated? Most cases of acute bronchitis clear up over time without treatment. Your health care provider may recommend: Drinking more fluids to help thin your mucus so it is easier to cough up. Taking inhaled medicine (inhaler) to improve air flow in and out of your lungs. Using a vaporizer or a humidifier. These are machines that add water to the air to help you breathe better. Taking a medicine that thins mucus and clears congestion (expectorant). Taking a medicine that prevents or stops coughing (cough suppressant). It is not common  to take an antibiotic medicine for this condition. Follow these instructions at home:  Take over-the-counter and prescription medicines only as told by your health care provider. Use an inhaler, vaporizer, or humidifier as told by your health care provider. Take two teaspoons (10 mL) of honey at bedtime to lessen coughing at night. Drink enough fluid to keep your urine pale yellow. Do not use any products that contain nicotine or tobacco. These products include cigarettes, chewing tobacco, and vaping devices, such as e-cigarettes. If you need help quitting, ask your health care provider. Get plenty of rest. Return to your normal activities as told by your health care provider. Ask your health care provider what activities are safe for you. Keep all follow-up visits. This is important. How is this prevented? To lower your risk of getting this condition again: Wash your hands often with soap and water for at least 20 seconds. If soap and water are not available, use hand sanitizer. Avoid contact with people who have cold symptoms. Try not to touch your mouth, nose, or eyes  with your hands. Avoid breathing in smoke or chemical fumes. Breathing smoke or chemical fumes will make your condition worse. Get the flu shot every year. Contact a health care provider if: Your symptoms do not improve after 2 weeks. You have trouble coughing up the mucus. Your cough keeps you awake at night. You have a fever. Get help right away if you: Cough up blood. Feel pain in your chest. Have severe shortness of breath. Faint or keep feeling like you are going to faint. Have a severe headache. Have a fever or chills that get worse. These symptoms may represent a serious problem that is an emergency. Do not wait to see if the symptoms will go away. Get medical help right away. Call your local emergency services (911 in the [place] Do not drive yourself to the hospital. Summary Acute bronchitis is inflammation of  the main airways (bronchi) that come off the windpipe (trachea) in the lungs. The swelling causes the airways to get smaller and make more mucus than normal. Drinking more fluids can help thin your mucus so it is easier to cough up. Take over-the-counter and prescription medicines only as told by your health care provider. Do not use any products that contain nicotine or tobacco. These products include cigarettes, chewing tobacco, and vaping devices, such as e-cigarettes. If you need help quitting, ask your health care provider. Contact a health care provider if your symptoms do not improve after 2 weeks. This information is not intended to replace advice given to you by your health care provider. Make sure you discuss any questions you have with your health care provider. Document Revised: 08/14/2021 Document Reviewed: 09/04/2020 Elsevier Patient Education  2024 [employer]    If you have been instructed to have an in-person evaluation today at a local Urgent Care facility, please use the link below. It will take you to a list of all of our available [facility], including address, phone number and hours of operation. Please do not delay care.  [facility]  If you or a family member do not have a primary care provider, use the link below to schedule a visit and establish care. When you choose a [facility] primary care physician or advanced practice provider, you gain a long-term partner in health. Find a Primary Care Provider  Learn more about [facility] in-office and virtual care options: [facility] Get Care Now

## 2023-02-19 NOTE — Progress Notes (Signed)
Virtual Visit Consent   [name], you are scheduled for a virtual visit with a [facility] provider today. Just as with appointments in the office, your consent must be obtained to participate. Your consent will be active for this visit and any virtual visit you may have with one of our providers in the next 365 days. If you have a MyChart account, a copy of this consent can be sent to you electronically.  As this is a virtual visit, video technology does not allow for your provider to perform a traditional examination. This may limit your provider's ability to fully assess your condition. If your provider identifies any concerns that need to be evaluated in person or the need to arrange testing (such as labs, EKG, etc.), we will make arrangements to do so. Although advances in technology are sophisticated, we cannot ensure that it will always work on either your end or our end. If the connection with a video visit is poor, the visit may have to be switched to a telephone visit. With either a video or telephone visit, we are not always able to ensure that we have a secure connection.  By engaging in this virtual visit, you consent to the provision of healthcare and authorize for your insurance to be billed (if applicable) for the services provided during this visit. Depending on your insurance coverage, you may receive a charge related to this service.  I need to obtain your verbal consent now. Are you willing to proceed with your visit today? Brittany Jenkins has provided verbal consent on 02/19/2023 for a virtual visit (video or telephone). Margaretann Loveless, PA-C  Date: 02/19/2023 11:15 AM  Virtual Visit via Video Note   I, Margaretann Loveless, connected with  Brittany Jenkins  ([id number], 05-18-1966) on 02/19/23 at 11:15 AM EDT by a video-enabled telemedicine application and verified that I am speaking with the correct person using two identifiers.  Location: Patient: Virtual Visit  Location Patient: Home Provider: Virtual Visit Location Provider: Home Office   I discussed the limitations of evaluation and management by telemedicine and the availability of in person appointments. The patient expressed understanding and agreed to proceed.    History of Present Illness: Brittany Jenkins is a 57 y.o. who identifies as a female who was assigned female at birth, and is being seen today for cough and congestion.  HPI: Cough This is a new problem. The current episode started 1 to 4 weeks ago (2 weeks). The problem has been gradually worsening. The problem occurs constantly. The cough is Productive of sputum and productive of purulent sputum. Associated symptoms include chest pain (tightness), chills, headaches, nasal congestion and shortness of breath (tighter to breathe). Pertinent negatives include no ear congestion, ear pain, fever, hemoptysis, postnasal drip, rhinorrhea, sore throat or wheezing. The symptoms are aggravated by lying down. She has tried a beta-agonist inhaler (sudafed, mucinex) for the symptoms. The treatment provided no relief. Her past medical history is significant for asthma and bronchitis.     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   Atopic dermatitis 10/30/2016   Healthcare maintenance 05/26/2016   Type 2 diabetes mellitus without complication, without long-term current use of insulin (HCC)    Asthma    GERD (gastroesophageal reflux disease)    Hyperlipidemia associated with type 2 diabetes mellitus (HCC)    Hypertension associated with type 2 diabetes  mellitus (HCC)    Microalbuminuria     Allergies:  Allergies  Allergen Reactions   Erythromycin Nausea And Vomiting    Severe vomiting   Metformin And Related Nausea And Vomiting   Victoza [Liraglutide] Diarrhea and Nausea Only   Medications:  Current Outpatient  Medications:    amoxicillin-clavulanate (AUGMENTIN) 875-125 MG tablet, Take 1 tablet by mouth 2 (two) times daily., Disp: 20 tablet, Rfl: 0   albuterol (VENTOLIN HFA) 108 (90 Base) MCG/ACT inhaler, Inhale 2 puffs into the lungs every 6 (six) hours as needed for wheezing or shortness of breath. **NEEDS TO BE SEEN BEFORE NEXT REFILL**, Disp: 18 each, Rfl: 0   amLODipine (NORVASC) 5 MG tablet, Take 1 tablet (5 mg total) by mouth daily. For blood pressure, Disp: 30 tablet, Rfl: 5   aspirin 81 MG chewable tablet, Chew by mouth daily., Disp: , Rfl:    atorvastatin (LIPITOR) 20 MG tablet, Take 1 tablet (20 mg total) by mouth daily., Disp: 90 tablet, Rfl: 1   diphenhydrAMINE (BENADRYL) 25 mg capsule, Take 25 mg by mouth every other day., Disp: , Rfl:    famotidine (PEPCID) 20 MG tablet, Take 1 tablet (20 mg total) by mouth 2 (two) times daily. (NEEDS TO BE SEEN BEFORE NEXT REFILL), Disp: 60 tablet, Rfl: 0   Ferrous Sulfate (IRON PO), Take by mouth., Disp: , Rfl:    fluticasone-salmeterol (WIXELA INHUB) 250-50 MCG/ACT AEPB, Inhale 1 puff into the lungs in the morning and at bedtime., Disp: 180 each, Rfl: 0   glipiZIDE (GLUCOTROL) 10 MG tablet, Take 1 tablet (10 mg total) by mouth daily before breakfast. **NEEDS TO BE SEEN BEFORE NEXT REFILL**, Disp: 60 tablet, Rfl: 0   lisinopril (ZESTRIL) 20 MG tablet, TAKE 1 TABLET BY MOUTH EVERY DAY, Disp: 30 tablet, Rfl: 2   metFORMIN (GLUCOPHAGE-XR) 500 MG 24 hr tablet, Take 1 tablet (500 mg total) by mouth 2 (two) times daily with a meal. **NEEDS TO BE SEEN BEFORE NEXT REFILL**, Disp: 60 tablet, Rfl: 0   Multiple Vitamin (MULTIVITAMIN PO), Take by mouth., Disp: , Rfl:    omeprazole (PRILOSEC) 20 MG capsule, Take 1 capsule (20 mg total) by mouth 2 (two) times daily before a meal., Disp: 180 capsule, Rfl: 1   ondansetron (ZOFRAN-ODT) 8 MG disintegrating tablet, Take 1 tablet (8 mg total) by mouth every 6 (six) hours as needed for nausea or vomiting., Disp: 20 tablet, Rfl:  1   sitaGLIPtin (JANUVIA) 100 MG tablet, TAKE 1 TABLET BY MOUTH EVERY DAY, Disp: 90 tablet, Rfl: 0  Observations/Objective: Patient is well-developed, well-nourished in no acute distress.  Resting comfortably at home.  Head is normocephalic, atraumatic.  No labored breathing.  Speech is clear and coherent with logical content.  Patient is alert and oriented at baseline.    Assessment and Plan: 1. Bacterial upper respiratory infection - amoxicillin-clavulanate (AUGMENTIN) 875-125 MG tablet; Take 1 tablet by mouth 2 (two) times daily.  Dispense: 20 tablet; Refill: 0  2. Mild persistent asthma with acute exacerbation - amoxicillin-clavulanate (AUGMENTIN) 875-125 MG tablet; Take 1 tablet by mouth 2 (two) times daily.  Dispense: 20 tablet; Refill: 0  - Worsening over a week despite OTC medications - Will treat with Augmentin - Can continue Mucinex  - Discussed Prednisone burst (would do 20mg  daily), but will hold off at this time due to being diabetic. Patient can reach out if symptoms of chest tightness and SOB not fully improving with inhalers and antibiotic - Push fluids.  -  Rest.  - Steam and humidifier can help - Seek in person evaluation if worsening or symptoms fail to improve    Follow Up Instructions: I discussed the assessment and treatment plan with the patient. The patient was provided an opportunity to ask questions and all were answered. The patient agreed with the plan and demonstrated an understanding of the instructions.  A copy of instructions were sent to the patient via MyChart unless otherwise noted below.    The patient was advised to call back or seek an in-person evaluation if the symptoms worsen or if the condition fails to improve as anticipated.   Margaretann Loveless, PA-C

## 2023-02-21 ENCOUNTER — Other Ambulatory Visit: Payer: Self-pay | Admitting: Nurse Practitioner

## 2023-02-21 DIAGNOSIS — E119 Type 2 diabetes mellitus without complications: Secondary | ICD-10-CM

## 2023-02-22 ENCOUNTER — Encounter: Payer: Self-pay | Admitting: Nurse Practitioner

## 2023-02-22 NOTE — Telephone Encounter (Signed)
LMTCB to schedule appt Letter mailed 

## 2023-02-22 NOTE — Telephone Encounter (Signed)
MMM NTBS 30 days given 01/20/23

## 2023-02-23 IMAGING — MG MM DIGITAL SCREENING BILAT W/ TOMO AND CAD
8 series · 8 of 24 positions shown · non-contrast
Comparison: Previous exam(s).

CLINICAL DATA: Screening.

EXAM:
DIGITAL SCREENING BILATERAL MAMMOGRAM WITH TOMOSYNTHESIS AND CAD
TECHNIQUE: Bilateral screening digital craniocaudal and mediolateral oblique
mammograms were obtained. Bilateral screening digital breast
tomosynthesis was performed. The images were evaluated with
computer-aided detection.

[R CC synth-2D]
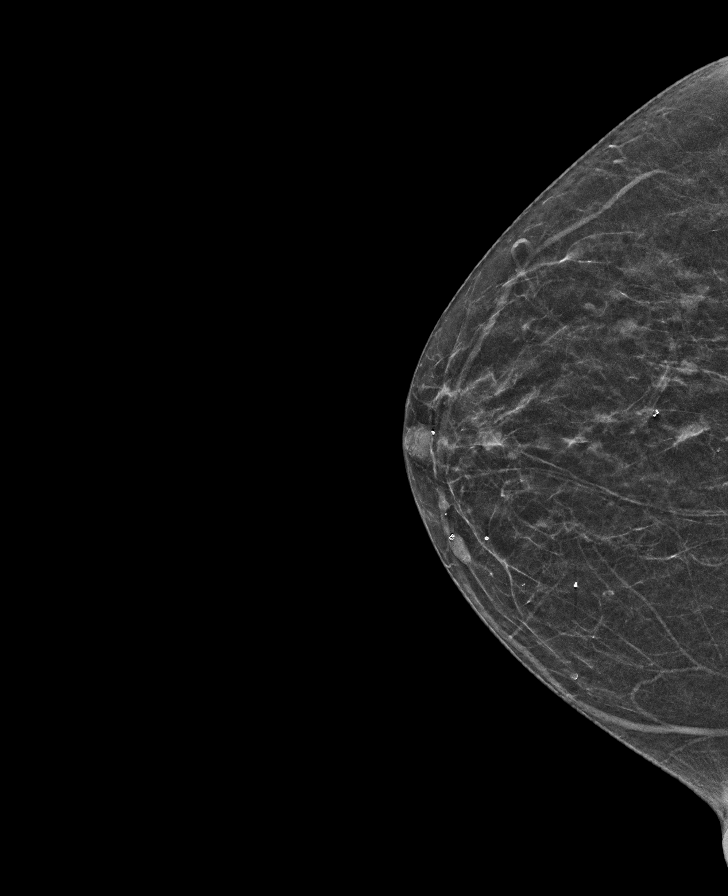

[L MLO synth-2D]
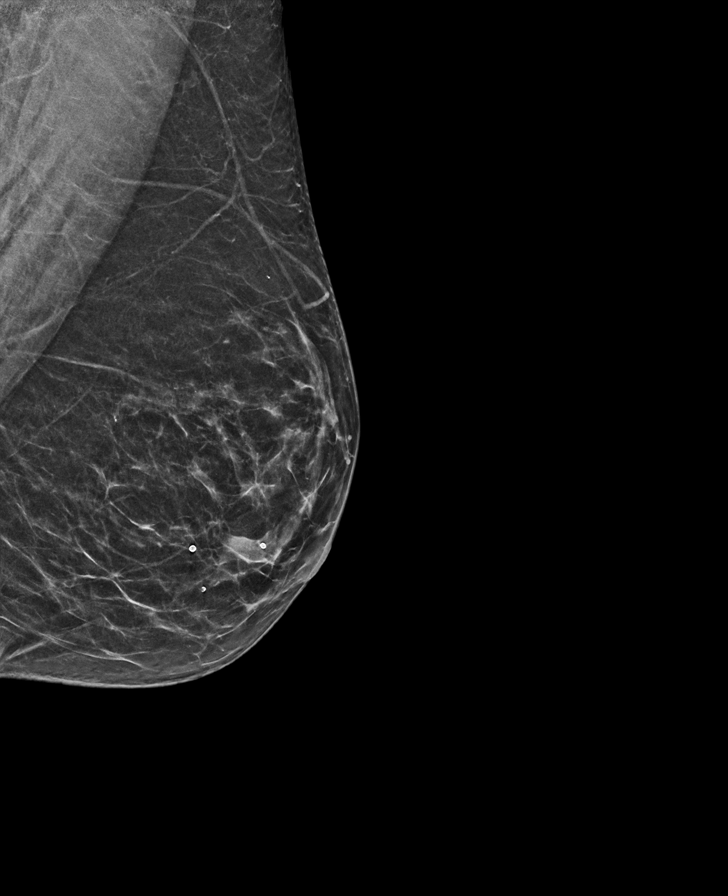

[R MLO synth-2D]
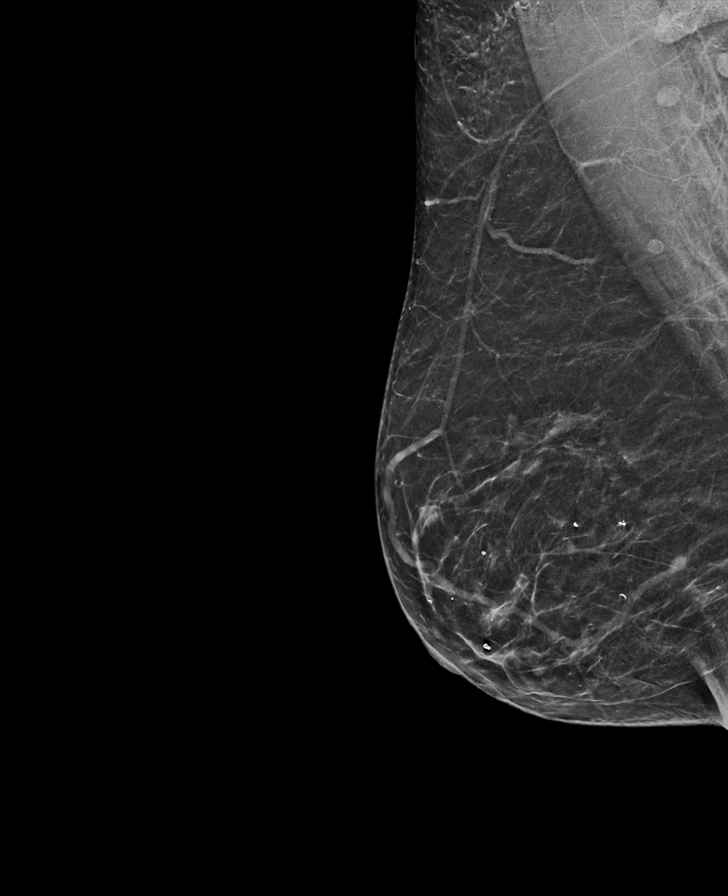

[L CC synth-2D]
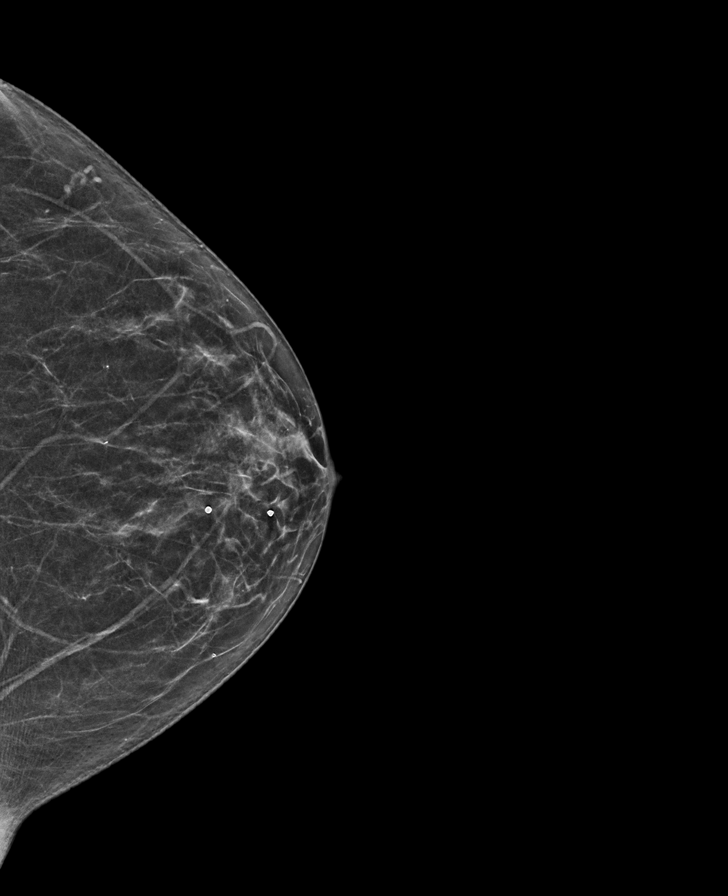

[R CC tomo · tomo slice 25/48.0]
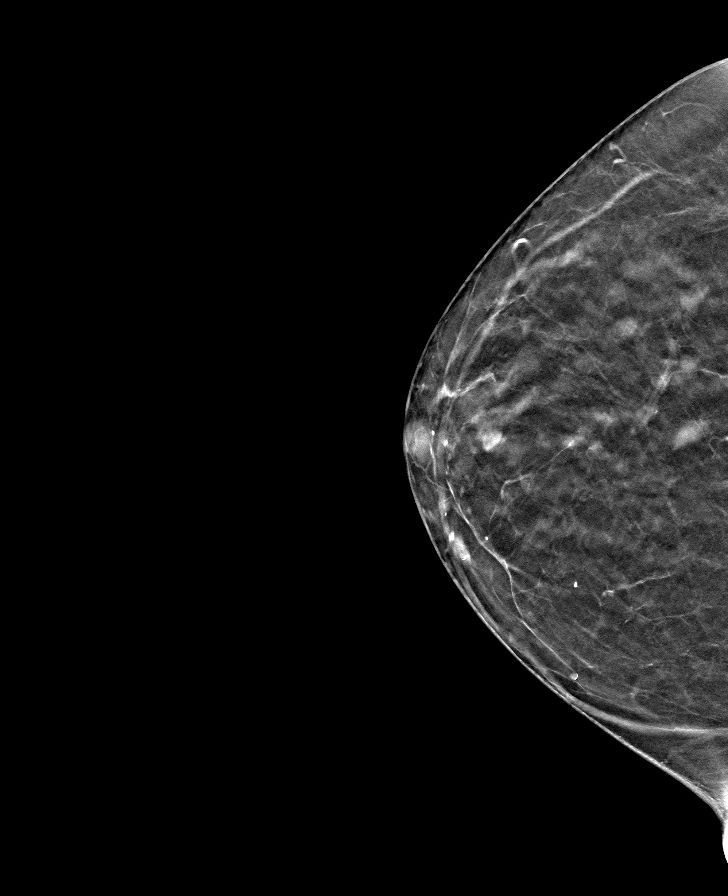

[R MLO tomo · tomo slice 31/60.0]
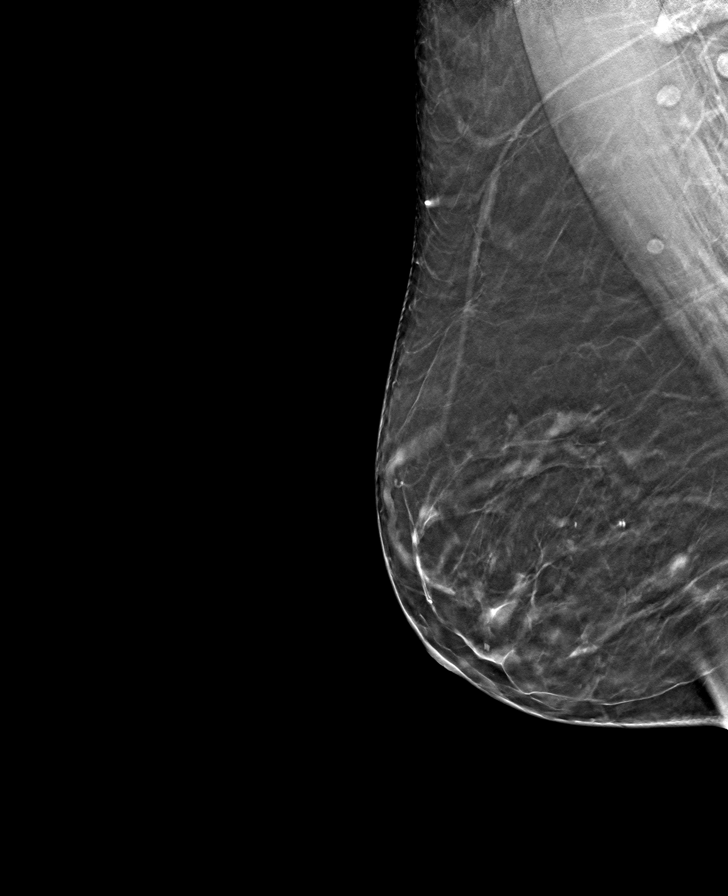

[L MLO tomo · tomo slice 29/56.0]
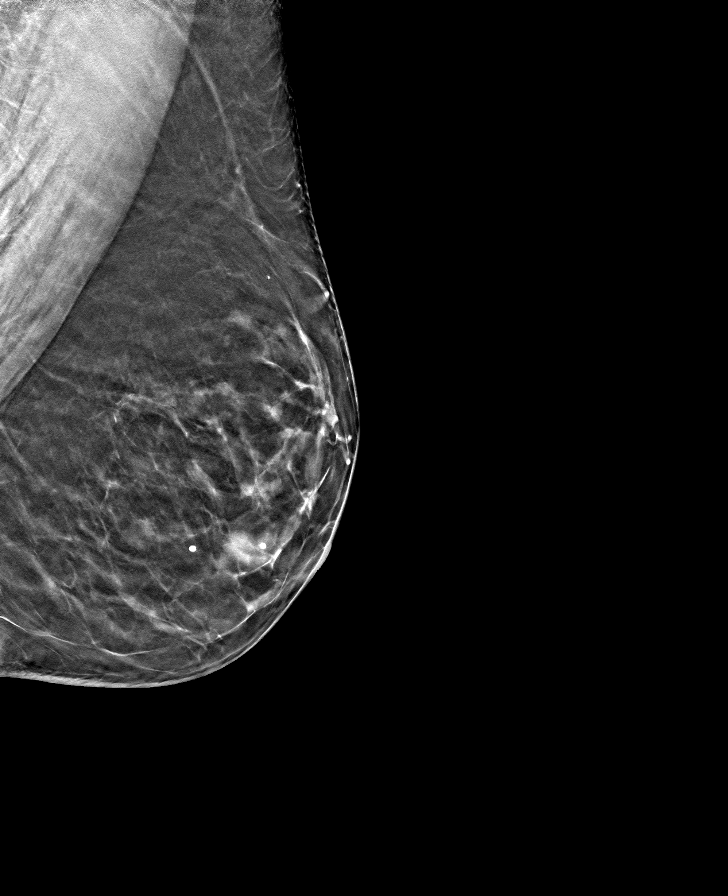

[L CC tomo · tomo slice 29/57.0]
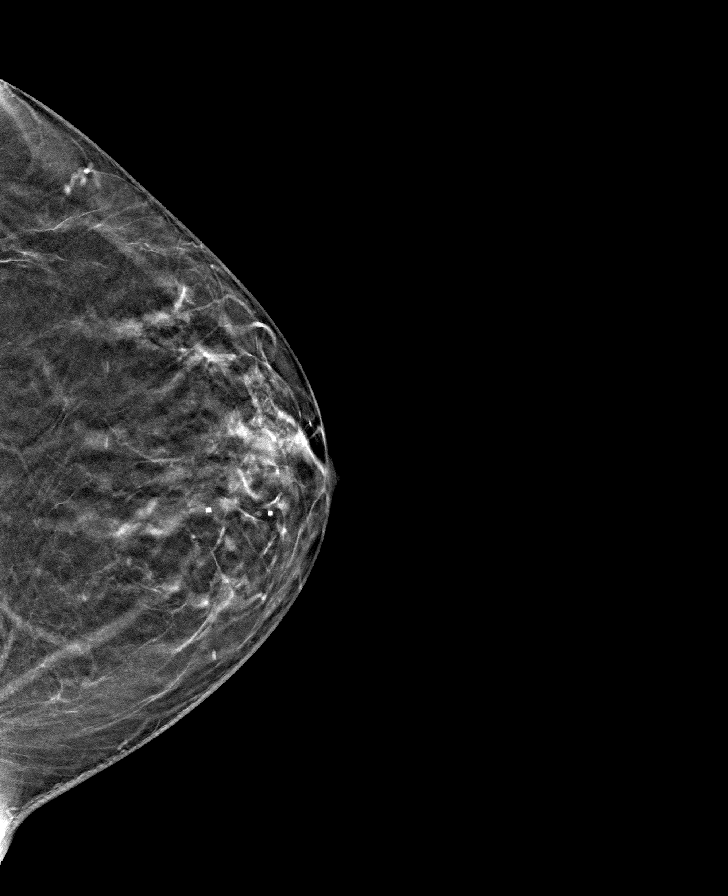

[8 of 24 positions shown; findings below may reference images not displayed]

ACR Breast Density Category b: There are scattered areas of
fibroglandular density.
FINDINGS: There are no findings suspicious for malignancy.
IMPRESSION: No mammographic evidence of malignancy. A result letter of this
screening mammogram will be mailed directly to the patient.

RECOMMENDATION:
Screening mammogram in one year. (Code:51-O-LD2)

BI-RADS CATEGORY  1: Negative.

## 2023-03-10 ENCOUNTER — Other Ambulatory Visit: Payer: Self-pay | Admitting: Nurse Practitioner

## 2023-03-10 DIAGNOSIS — J4520 Mild intermittent asthma, uncomplicated: Secondary | ICD-10-CM

## 2023-03-21 ENCOUNTER — Other Ambulatory Visit: Payer: Self-pay | Admitting: Nurse Practitioner

## 2023-03-21 DIAGNOSIS — J4520 Mild intermittent asthma, uncomplicated: Secondary | ICD-10-CM

## 2023-03-24 ENCOUNTER — Telehealth: Payer: 59 | Admitting: Physician Assistant

## 2023-03-24 ENCOUNTER — Encounter: Payer: Self-pay | Admitting: Physician Assistant

## 2023-03-24 DIAGNOSIS — E119 Type 2 diabetes mellitus without complications: Secondary | ICD-10-CM

## 2023-03-24 MED ORDER — GLIPIZIDE 10 MG PO TABS: 10.0000 mg | ORAL_TABLET | Freq: Every day | ORAL | 1 refills | Status: AC

## 2023-03-24 NOTE — Progress Notes (Signed)
Virtual Visit Consent   Brittany Jenkins, you are scheduled for a virtual visit with a [facility] provider today. Just as with appointments in the office, your consent must be obtained to participate. Your consent will be active for this visit and any virtual visit you may have with one of our providers in the next 365 days. If you have a MyChart account, a copy of this consent can be sent to you electronically.  As this is a virtual visit, video technology does not allow for your provider to perform a traditional examination. This may limit your provider's ability to fully assess your condition. If your provider identifies any concerns that need to be evaluated in person or the need to arrange testing (such as labs, EKG, etc.), we will make arrangements to do so. Although advances in technology are sophisticated, we cannot ensure that it will always work on either your end or our end. If the connection with a video visit is poor, the visit may have to be switched to a telephone visit. With either a video or telephone visit, we are not always able to ensure that we have a secure connection.  By engaging in this virtual visit, you consent to the provision of healthcare and authorize for your insurance to be billed (if applicable) for the services provided during this visit. Depending on your insurance coverage, you may receive a charge related to this service.  I need to obtain your verbal consent now. Are you willing to proceed with your visit today? Brittany Jenkins has provided verbal consent on 03/24/2023 for a virtual visit (video or telephone). Piedad Climes, [place]  Date: 03/24/2023 3:17 PM  Virtual Visit via Video Note   I, Piedad Climes, connected with  Brittany Jenkins  ([id number], [birth year]) on 03/24/23 at  3:15 PM EST by a video-enabled telemedicine application and verified that I am speaking with the correct person using two identifiers.  Location: Patient: Virtual Visit  Location Patient: Home Provider: Virtual Visit Location Provider: Home Office   I discussed the limitations of evaluation and management by telemedicine and the availability of in person appointments. The patient expressed understanding and agreed to proceed.    History of Present Illness: Brittany Jenkins is a 57 y.o. who identifies as a female who was assigned female at birth, and is being seen today for need of a temporary medication refill.   Out of Glipizide for 10 days. Has upcoming appointment with an Endocrinologist at [facility] but is not for a few months. Ever since she has been out of the medicine has noted increase in her fasting glucose levels up to 250s. Before running out of Glipizide her AM glucose was around 100 and non-fasting was 150. Is still taking her Januvia as directed. Does not tolerate Metformin so has not been taking this for quite some time. Last visit with PCP was back in June of. Denies vision change, AMS. Notes some fatigue that she feels related.   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   Atopic dermatitis 10/30/2016   Healthcare maintenance 05/26/2016   Type 2 diabetes mellitus without complication, without long-term current use of insulin (HCC)    Asthma    GERD (gastroesophageal reflux disease)    Hyperlipidemia associated with type 2 diabetes mellitus (HCC)    Hypertension associated with type 2 diabetes mellitus (HCC)  Microalbuminuria     Allergies:  Allergies  Allergen Reactions   Erythromycin Nausea And Vomiting    Severe vomiting   Metformin And Related Nausea And Vomiting   Victoza [Liraglutide] Diarrhea and Nausea Only   Medications:  Current Outpatient Medications:    albuterol (VENTOLIN HFA) 108 (90 Base) MCG/ACT inhaler, Inhale 2 puffs into the lungs every 6 (six) hours as needed for wheezing or  shortness of breath., Disp: 18 each, Rfl: 1   amLODipine (NORVASC) 5 MG tablet, Take 1 tablet (5 mg total) by mouth daily. For blood pressure, Disp: 30 tablet, Rfl: 5   amoxicillin-clavulanate (AUGMENTIN) 875-125 MG tablet, Take 1 tablet by mouth 2 (two) times daily., Disp: 20 tablet, Rfl: 0   aspirin 81 MG chewable tablet, Chew by mouth daily., Disp: , Rfl:    atorvastatin (LIPITOR) 20 MG tablet, Take 1 tablet (20 mg total) by mouth daily., Disp: 90 tablet, Rfl: 1   diphenhydrAMINE (BENADRYL) 25 mg capsule, Take 25 mg by mouth every other day., Disp: , Rfl:    famotidine (PEPCID) 20 MG tablet, Take 1 tablet (20 mg total) by mouth 2 (two) times daily. (NEEDS TO BE SEEN BEFORE NEXT REFILL), Disp: 60 tablet, Rfl: 0   Ferrous Sulfate (IRON PO), Take by mouth., Disp: , Rfl:    fluticasone-salmeterol (WIXELA INHUB) 250-50 MCG/ACT AEPB, Inhale 1 puff into the lungs 2 (two) times daily. in the morning and at bedtime. **NEEDS TO BE SEEN BEFORE NEXT REFILL**, Disp: 60 each, Rfl: 0   glipiZIDE (GLUCOTROL) 10 MG tablet, Take 1 tablet (10 mg total) by mouth daily before breakfast. One-time refill of medication until follow-up with PCP, Disp: 60 tablet, Rfl: 1   lisinopril (ZESTRIL) 20 MG tablet, TAKE 1 TABLET BY MOUTH EVERY DAY, Disp: 30 tablet, Rfl: 2   metFORMIN (GLUCOPHAGE-XR) 500 MG 24 hr tablet, Take 1 tablet (500 mg total) by mouth 2 (two) times daily with a meal. **NEEDS TO BE SEEN BEFORE NEXT REFILL**, Disp: 60 tablet, Rfl: 0   Multiple Vitamin (MULTIVITAMIN PO), Take by mouth., Disp: , Rfl:    omeprazole (PRILOSEC) 20 MG capsule, Take 1 capsule (20 mg total) by mouth 2 (two) times daily before a meal., Disp: 180 capsule, Rfl: 1   ondansetron (ZOFRAN-ODT) 8 MG disintegrating tablet, Take 1 tablet (8 mg total) by mouth every 6 (six) hours as needed for nausea or vomiting., Disp: 20 tablet, Rfl: 1   sitaGLIPtin (JANUVIA) 100 MG tablet, TAKE 1 TABLET BY MOUTH EVERY DAY, Disp: 90 tablet, Rfl:  0  Observations/Objective: Patient is well-developed, well-nourished in no acute distress.  Resting comfortably  at home.  Head is normocephalic, atraumatic.  No labored breathing.  Speech is clear and coherent with logical content.  Patient is alert and oriented at baseline.   Assessment and Plan: 1. Type 2 diabetes mellitus without complication, without long-term current use of insulin (HCC) - glipiZIDE (GLUCOTROL) 10 MG tablet; Take 1 tablet (10 mg total) by mouth daily before breakfast. One-time refill of medication until follow-up with PCP  Dispense: 60 tablet; Refill: 1  Will send in a one-time refill until she can see her new providers. Needs in person evaluation if unable to see them before refill is out or if glucose is not going down to previous levels.  Follow Up Instructions: I discussed the assessment and treatment plan with the patient. The patient was provided an opportunity to ask questions and all were answered. The patient agreed with the plan and  demonstrated an understanding of the instructions.  A copy of instructions were sent to the patient via MyChart unless otherwise noted below.    The patient was advised to call back or seek an in-person evaluation if the symptoms worsen or if the condition fails to improve as anticipated.    Piedad Climes, PA-C

## 2023-03-24 NOTE — Patient Instructions (Signed)
Sheppard Penton, thank you for joining Piedad Climes, PA-C for today's virtual visit.  While this provider is not your primary care provider (PCP), if your PCP is located in our provider database this encounter information will be shared with them immediately following your visit.   A [place] MyChart account gives you access to today's visit and all your visits, tests, and labs performed at [facility] " click here if you don't have a [facility] MyChart account or go to [email or web address]  Consent: (Patient) Brittany Jenkins provided verbal consent for this virtual visit at the beginning of the encounter.  Current Medications:  Current Outpatient Medications:    albuterol (VENTOLIN HFA) 108 (90 Base) MCG/ACT inhaler, Inhale 2 puffs into the lungs every 6 (six) hours as needed for wheezing or shortness of breath., Disp: 18 each, Rfl: 1   amLODipine (NORVASC) 5 MG tablet, Take 1 tablet (5 mg total) by mouth daily. For blood pressure, Disp: 30 tablet, Rfl: 5   amoxicillin-clavulanate (AUGMENTIN) 875-125 MG tablet, Take 1 tablet by mouth 2 (two) times daily., Disp: 20 tablet, Rfl: 0   aspirin 81 MG chewable tablet, Chew by mouth daily., Disp: , Rfl:    atorvastatin (LIPITOR) 20 MG tablet, Take 1 tablet (20 mg total) by mouth daily., Disp: 90 tablet, Rfl: 1   diphenhydrAMINE (BENADRYL) 25 mg capsule, Take 25 mg by mouth every other day., Disp: , Rfl:    famotidine (PEPCID) 20 MG tablet, Take 1 tablet (20 mg total) by mouth 2 (two) times daily. (NEEDS TO BE SEEN BEFORE NEXT REFILL), Disp: 60 tablet, Rfl: 0   Ferrous Sulfate (IRON PO), Take by mouth., Disp: , Rfl:    fluticasone-salmeterol (WIXELA INHUB) 250-50 MCG/ACT AEPB, Inhale 1 puff into the lungs 2 (two) times daily. in the morning and at bedtime. **NEEDS TO BE SEEN BEFORE NEXT REFILL**, Disp: 60 each, Rfl: 0   glipiZIDE (GLUCOTROL) 10 MG tablet, Take 1 tablet (10 mg total) by mouth daily before breakfast. One-time  refill of medication until follow-up with PCP, Disp: 60 tablet, Rfl: 1   lisinopril (ZESTRIL) 20 MG tablet, TAKE 1 TABLET BY MOUTH EVERY DAY, Disp: 30 tablet, Rfl: 2   metFORMIN (GLUCOPHAGE-XR) 500 MG 24 hr tablet, Take 1 tablet (500 mg total) by mouth 2 (two) times daily with a meal. **NEEDS TO BE SEEN BEFORE NEXT REFILL**, Disp: 60 tablet, Rfl: 0   Multiple Vitamin (MULTIVITAMIN PO), Take by mouth., Disp: , Rfl:    omeprazole (PRILOSEC) 20 MG capsule, Take 1 capsule (20 mg total) by mouth 2 (two) times daily before a meal., Disp: 180 capsule, Rfl: 1   ondansetron (ZOFRAN-ODT) 8 MG disintegrating tablet, Take 1 tablet (8 mg total) by mouth every 6 (six) hours as needed for nausea or vomiting., Disp: 20 tablet, Rfl: 1   sitaGLIPtin (JANUVIA) 100 MG tablet, TAKE 1 TABLET BY MOUTH EVERY DAY, Disp: 90 tablet, Rfl: 0   Medications ordered in this encounter:  Meds ordered this encounter  Medications   glipiZIDE (GLUCOTROL) 10 MG tablet    Sig: Take 1 tablet (10 mg total) by mouth daily before breakfast. One-time refill of medication until follow-up with PCP    Dispense:  60 tablet    Refill:  1    Order Specific Question:   Supervising Provider    Answer:   Merrilee Jansky [id number]     *If you need refills on other medications prior to your next appointment, please contact your pharmacy*  Follow-Up: Call back or seek an in-person evaluation if the symptoms worsen or if the condition fails to improve as anticipated.  [facility] [phone]  Other Instructions   If you have been instructed to have an in-person evaluation today at a local Urgent Care facility, please use the link below. It will take you to a list of all of our available [facility], including address, phone number and hours of operation. Please do not delay care.  [facility]  If you or a family member do not have a primary care provider, use the link below to schedule a visit and  establish care. When you choose a [facility] primary care physician or advanced practice provider, you gain a long-term partner in health. Find a Primary Care Provider  Learn more about [facility]'s in-office and virtual care options: [facility] - Get Care Now

## 2023-03-30 DIAGNOSIS — E119 Type 2 diabetes mellitus without complications: Secondary | ICD-10-CM | POA: Diagnosis not present

## 2023-03-30 DIAGNOSIS — Z8349 Family history of other endocrine, nutritional and metabolic diseases: Secondary | ICD-10-CM | POA: Diagnosis not present

## 2023-03-30 DIAGNOSIS — I10 Essential (primary) hypertension: Secondary | ICD-10-CM | POA: Diagnosis not present

## 2023-04-04 ENCOUNTER — Other Ambulatory Visit: Payer: Self-pay | Admitting: Nurse Practitioner

## 2023-04-04 DIAGNOSIS — J4520 Mild intermittent asthma, uncomplicated: Secondary | ICD-10-CM

## 2023-04-05 ENCOUNTER — Encounter: Payer: Self-pay | Admitting: Nurse Practitioner

## 2023-04-05 NOTE — Telephone Encounter (Signed)
LMTCB to schedule appt Letter mailed 

## 2023-04-05 NOTE — Telephone Encounter (Signed)
MMM NTBS 30-d given 03/10/23

## 2023-04-06 DIAGNOSIS — N941 Unspecified dyspareunia: Secondary | ICD-10-CM | POA: Diagnosis not present

## 2023-04-06 DIAGNOSIS — R102 Pelvic and perineal pain: Secondary | ICD-10-CM | POA: Diagnosis not present

## 2023-04-06 DIAGNOSIS — R300 Dysuria: Secondary | ICD-10-CM | POA: Diagnosis not present

## 2023-04-06 DIAGNOSIS — N952 Postmenopausal atrophic vaginitis: Secondary | ICD-10-CM | POA: Diagnosis not present

## 2023-04-15 ENCOUNTER — Other Ambulatory Visit: Payer: Self-pay | Admitting: Family Medicine

## 2023-04-22 ENCOUNTER — Telehealth: Payer: Self-pay | Admitting: Nurse Practitioner

## 2023-05-08 ENCOUNTER — Other Ambulatory Visit: Payer: Self-pay | Admitting: Nurse Practitioner

## 2023-05-08 DIAGNOSIS — J4520 Mild intermittent asthma, uncomplicated: Secondary | ICD-10-CM

## 2023-05-12 ENCOUNTER — Other Ambulatory Visit: Payer: Self-pay | Admitting: Nurse Practitioner

## 2023-05-12 DIAGNOSIS — E1159 Type 2 diabetes mellitus with other circulatory complications: Secondary | ICD-10-CM

## 2023-05-13 ENCOUNTER — Other Ambulatory Visit: Payer: Self-pay | Admitting: Nurse Practitioner

## 2023-05-13 DIAGNOSIS — E119 Type 2 diabetes mellitus without complications: Secondary | ICD-10-CM

## 2023-05-14 MED ORDER — SITAGLIPTIN PHOSPHATE 100 MG PO TABS: 100.0000 mg | ORAL_TABLET | Freq: Every day | ORAL | 0 refills | Status: AC

## 2023-05-14 NOTE — Addendum Note (Signed)
Addended by: Julious Payer D on: 05/14/2023 08:50 AM   Modules accepted: Orders

## 2023-05-14 NOTE — Telephone Encounter (Signed)
Refill failed. resent °

## 2023-06-06 ENCOUNTER — Other Ambulatory Visit: Payer: Self-pay | Admitting: Nurse Practitioner

## 2023-06-06 DIAGNOSIS — E1159 Type 2 diabetes mellitus with other circulatory complications: Secondary | ICD-10-CM

## 2023-06-07 NOTE — Telephone Encounter (Signed)
10/26/22 pt NTBS 30-d given 05/13/23

## 2023-06-10 ENCOUNTER — Other Ambulatory Visit: Payer: Self-pay | Admitting: Nurse Practitioner

## 2023-06-10 DIAGNOSIS — E1159 Type 2 diabetes mellitus with other circulatory complications: Secondary | ICD-10-CM

## 2023-06-10 NOTE — Telephone Encounter (Signed)
Pt said she changed doctors

## 2023-06-10 NOTE — Telephone Encounter (Signed)
10/26/22 pt NTBS 30-d given 05/13/23

## 2023-06-10 NOTE — Telephone Encounter (Signed)
Left message to call back  

## 2023-06-14 ENCOUNTER — Other Ambulatory Visit: Payer: Self-pay | Admitting: Nurse Practitioner

## 2023-06-14 DIAGNOSIS — E119 Type 2 diabetes mellitus without complications: Secondary | ICD-10-CM

## 2023-07-12 ENCOUNTER — Other Ambulatory Visit: Payer: Self-pay | Admitting: Nurse Practitioner

## 2023-07-12 DIAGNOSIS — J4520 Mild intermittent asthma, uncomplicated: Secondary | ICD-10-CM

## 2023-10-27 ENCOUNTER — Telehealth: Admitting: Physician Assistant

## 2023-10-27 DIAGNOSIS — J208 Acute bronchitis due to other specified organisms: Secondary | ICD-10-CM | POA: Diagnosis not present

## 2023-10-27 DIAGNOSIS — B9689 Other specified bacterial agents as the cause of diseases classified elsewhere: Secondary | ICD-10-CM

## 2023-10-28 MED ORDER — DOXYCYCLINE HYCLATE 100 MG PO TABS: 100.0000 mg | ORAL_TABLET | Freq: Two times a day (BID) | ORAL | 0 refills | Status: AC

## 2023-10-28 MED ORDER — BENZONATATE 100 MG PO CAPS: 100.0000 mg | ORAL_CAPSULE | Freq: Two times a day (BID) | ORAL | 0 refills | Status: AC | PRN

## 2023-10-28 NOTE — Progress Notes (Signed)
 I have spent 5 minutes in review of e-visit questionnaire, review and updating patient chart, medical decision making and response to patient.   Piedad Climes, PA-C

## 2023-10-28 NOTE — Progress Notes (Signed)

## 2024-01-04 ENCOUNTER — Other Ambulatory Visit: Payer: Self-pay | Admitting: Nurse Practitioner

## 2024-01-04 DIAGNOSIS — Z1231 Encounter for screening mammogram for malignant neoplasm of breast: Secondary | ICD-10-CM

## 2024-02-09 ENCOUNTER — Ambulatory Visit
